# Patient Record
Sex: Male | Born: 1985 | Race: Black or African American | Hispanic: No | Marital: Single | Smoking: Current every day smoker
Health system: Southern US, Community
[De-identification: ages and names within clinical notes are randomized; demographics above are authoritative.]

## PROBLEM LIST (undated history)

## (undated) ENCOUNTER — Emergency Department: Admission: EM | Payer: Self-pay | Source: Home / Self Care

## (undated) DIAGNOSIS — M109 Gout, unspecified: Secondary | ICD-10-CM

## (undated) DIAGNOSIS — J45909 Unspecified asthma, uncomplicated: Secondary | ICD-10-CM

## (undated) HISTORY — PX: FRACTURE SURGERY: SHX138

## (undated) HISTORY — PX: OTHER SURGICAL HISTORY: SHX169

---

## 2006-04-05 ENCOUNTER — Emergency Department: Payer: Self-pay | Admitting: Emergency Medicine

## 2006-07-08 ENCOUNTER — Emergency Department: Payer: Self-pay | Admitting: Emergency Medicine

## 2006-09-25 ENCOUNTER — Emergency Department: Payer: Self-pay | Admitting: Emergency Medicine

## 2006-12-05 ENCOUNTER — Emergency Department: Payer: Self-pay | Admitting: Emergency Medicine

## 2006-12-31 ENCOUNTER — Emergency Department: Payer: Self-pay | Admitting: Emergency Medicine

## 2007-04-08 ENCOUNTER — Emergency Department: Payer: Self-pay | Admitting: Emergency Medicine

## 2007-12-28 ENCOUNTER — Emergency Department: Payer: Self-pay | Admitting: Emergency Medicine

## 2009-03-14 ENCOUNTER — Emergency Department: Payer: Self-pay | Admitting: Emergency Medicine

## 2009-09-27 ENCOUNTER — Emergency Department (HOSPITAL_COMMUNITY): Admission: EM | Admit: 2009-09-27 | Discharge: 2009-09-27 | Payer: Self-pay | Admitting: Family Medicine

## 2009-10-07 ENCOUNTER — Ambulatory Visit: Payer: Self-pay | Admitting: Psychiatry

## 2009-10-07 ENCOUNTER — Ambulatory Visit: Payer: Self-pay | Admitting: Pulmonary Disease

## 2009-10-07 ENCOUNTER — Inpatient Hospital Stay (HOSPITAL_COMMUNITY): Admission: EM | Admit: 2009-10-07 | Discharge: 2009-10-10 | Payer: Self-pay | Admitting: Emergency Medicine

## 2009-10-10 ENCOUNTER — Inpatient Hospital Stay (HOSPITAL_COMMUNITY): Admission: AD | Admit: 2009-10-10 | Discharge: 2009-10-12 | Payer: Self-pay | Admitting: Psychiatry

## 2009-10-10 ENCOUNTER — Ambulatory Visit: Payer: Self-pay | Admitting: Psychiatry

## 2009-10-27 ENCOUNTER — Ambulatory Visit: Payer: Self-pay | Admitting: Psychiatry

## 2009-11-01 ENCOUNTER — Ambulatory Visit: Payer: Self-pay | Admitting: Psychiatry

## 2009-11-14 ENCOUNTER — Emergency Department (HOSPITAL_COMMUNITY): Admission: EM | Admit: 2009-11-14 | Discharge: 2009-11-14 | Payer: Self-pay | Admitting: Family Medicine

## 2010-07-30 LAB — BLOOD GAS, ARTERIAL
Acid-base deficit: 0.8 mmol/L (ref 0.0–2.0)
Bicarbonate: 23.7 mEq/L (ref 20.0–24.0)
Drawn by: 275531
FIO2: 100 %
MECHVT: 550 mL
O2 Saturation: 99.5 %
PEEP: 5 cmH2O
Patient temperature: 98.6
RATE: 15 resp/min
TCO2: 21 mmol/L (ref 0–100)
pCO2 arterial: 40.7 mmHg (ref 35.0–45.0)
pH, Arterial: 7.382 (ref 7.350–7.450)
pO2, Arterial: 459 mmHg — ABNORMAL HIGH (ref 80.0–100.0)

## 2010-07-30 LAB — CBC
HCT: 41.3 % (ref 39.0–52.0)
HCT: 42.5 % (ref 39.0–52.0)
Hemoglobin: 13.9 g/dL (ref 13.0–17.0)
Hemoglobin: 14.4 g/dL (ref 13.0–17.0)
MCHC: 33.8 g/dL (ref 30.0–36.0)
MCHC: 33.8 g/dL (ref 30.0–36.0)
MCV: 90.5 fL (ref 78.0–100.0)
MCV: 90.7 fL (ref 78.0–100.0)
Platelets: 183 10*3/uL (ref 150–400)
Platelets: 187 10*3/uL (ref 150–400)
RBC: 4.56 MIL/uL (ref 4.22–5.81)
RBC: 4.69 MIL/uL (ref 4.22–5.81)
RDW: 12.8 % (ref 11.5–15.5)
RDW: 13.5 % (ref 11.5–15.5)
WBC: 10.1 10*3/uL (ref 4.0–10.5)
WBC: 10.1 10*3/uL (ref 4.0–10.5)

## 2010-07-30 LAB — BASIC METABOLIC PANEL
BUN: 10 mg/dL (ref 6–23)
CO2: 27 mEq/L (ref 19–32)
Calcium: 8.6 mg/dL (ref 8.4–10.5)
Chloride: 110 mEq/L (ref 96–112)
Creatinine, Ser: 1.09 mg/dL (ref 0.4–1.5)
GFR calc Af Amer: >60 mL/min (ref >60)
GFR calc non Af Amer: >60 mL/min (ref >60)
Glucose, Bld: 118 mg/dL — ABNORMAL HIGH (ref 70–99)
Potassium: 3.9 mEq/L (ref 3.5–5.1)
Sodium: 140 mEq/L (ref 135–145)

## 2010-07-30 LAB — CULTURE, BLOOD (ROUTINE X 2)
Culture: NO GROWTH
Culture: NO GROWTH

## 2010-07-30 LAB — POCT I-STAT, CHEM 8
BUN: 13 mg/dL (ref 6–23)
Calcium, Ion: 1.05 mmol/L — ABNORMAL LOW (ref 1.12–1.32)
Chloride: 105 mEq/L (ref 96–112)
Creatinine, Ser: 0.9 mg/dL (ref 0.4–1.5)
Glucose, Bld: 147 mg/dL — ABNORMAL HIGH (ref 70–99)
HCT: 46 % (ref 39.0–52.0)
Hemoglobin: 15.6 g/dL (ref 13.0–17.0)
Potassium: 3.1 mEq/L — ABNORMAL LOW (ref 3.5–5.1)
Sodium: 141 mEq/L (ref 135–145)
TCO2: 21 mmol/L (ref 0–100)

## 2010-07-30 LAB — DIFFERENTIAL
Basophils Absolute: 0 10*3/uL (ref 0.0–0.1)
Basophils Relative: 0 % (ref 0–1)
Eosinophils Absolute: 0 10*3/uL (ref 0.0–0.7)
Eosinophils Relative: 0 % (ref 0–5)
Lymphocytes Relative: 9 % — ABNORMAL LOW (ref 12–46)
Lymphs Abs: 1 10*3/uL (ref 0.7–4.0)
Monocytes Absolute: 0.8 10*3/uL (ref 0.1–1.0)
Monocytes Relative: 8 % (ref 3–12)
Neutro Abs: 8.3 10*3/uL — ABNORMAL HIGH (ref 1.7–7.7)
Neutrophils Relative %: 83 % — ABNORMAL HIGH (ref 43–77)

## 2010-07-30 LAB — HEPATIC FUNCTION PANEL
ALT: 27 U/L (ref 0–53)
AST: 31 U/L (ref 0–37)
Albumin: 3.7 g/dL (ref 3.5–5.2)
Alkaline Phosphatase: 42 U/L (ref 39–117)
Bilirubin, Direct: 0.3 mg/dL (ref 0.0–0.3)
Indirect Bilirubin: 0.7 mg/dL (ref 0.3–0.9)
Total Bilirubin: 1 mg/dL (ref 0.3–1.2)
Total Protein: 6.6 g/dL (ref 6.0–8.3)

## 2010-07-30 LAB — RAPID URINE DRUG SCREEN, HOSP PERFORMED
Amphetamines: NOT DETECTED
Barbiturates: NOT DETECTED
Benzodiazepines: NOT DETECTED
Cocaine: NOT DETECTED
Opiates: NOT DETECTED
Tetrahydrocannabinol: NOT DETECTED

## 2010-07-30 LAB — TRICYCLICS SCREEN, URINE: TCA Scrn: POSITIVE — AB

## 2010-07-30 LAB — PROTIME-INR
INR: 1.18 (ref 0.00–1.49)
Prothrombin Time: 14.9 seconds (ref 11.6–15.2)

## 2010-07-30 LAB — SALICYLATE LEVEL: Salicylate Lvl: <4 mg/dL (ref 2.8–20.0)

## 2010-07-30 LAB — ACETAMINOPHEN LEVEL: Acetaminophen (Tylenol), Serum: <10 ug/mL — ABNORMAL LOW (ref 10–30)

## 2010-07-30 LAB — ETHANOL: Alcohol, Ethyl (B): <5 mg/dL (ref 0–10)

## 2010-07-30 LAB — MRSA PCR SCREENING: MRSA by PCR: NEGATIVE

## 2011-06-06 ENCOUNTER — Emergency Department: Payer: Self-pay | Admitting: Emergency Medicine

## 2011-06-08 ENCOUNTER — Emergency Department: Payer: Self-pay | Admitting: Internal Medicine

## 2011-11-06 ENCOUNTER — Emergency Department: Payer: Self-pay | Admitting: Internal Medicine

## 2012-03-19 ENCOUNTER — Emergency Department: Payer: Self-pay | Admitting: Unknown Physician Specialty

## 2012-03-20 ENCOUNTER — Emergency Department: Payer: Self-pay | Admitting: Emergency Medicine

## 2012-05-17 ENCOUNTER — Emergency Department: Payer: Self-pay | Admitting: Internal Medicine

## 2012-10-15 ENCOUNTER — Emergency Department: Payer: Self-pay | Admitting: Emergency Medicine

## 2012-11-02 ENCOUNTER — Emergency Department: Payer: Self-pay | Admitting: Emergency Medicine

## 2012-11-02 LAB — URINALYSIS, COMPLETE
Bacteria: NONE SEEN
Bilirubin,UR: NEGATIVE
Blood: NEGATIVE
Glucose,UR: NEGATIVE mg/dL (ref 0–75)
Ketone: NEGATIVE
Nitrite: NEGATIVE
Ph: 7 (ref 4.5–8.0)
Protein: NEGATIVE
RBC,UR: 2 /HPF (ref 0–5)
Specific Gravity: 1.015 (ref 1.003–1.030)
Squamous Epithelial: NONE SEEN
WBC UR: 5 /HPF (ref 0–5)

## 2012-11-02 LAB — COMPREHENSIVE METABOLIC PANEL
Albumin: 3.8 g/dL (ref 3.4–5.0)
Alkaline Phosphatase: 61 U/L (ref 50–136)
Anion Gap: 6 — ABNORMAL LOW (ref 7–16)
BUN: 22 mg/dL — ABNORMAL HIGH (ref 7–18)
Bilirubin,Total: 0.4 mg/dL (ref 0.2–1.0)
Calcium, Total: 8.8 mg/dL (ref 8.5–10.1)
Chloride: 104 mmol/L (ref 98–107)
Co2: 28 mmol/L (ref 21–32)
Creatinine: 1.24 mg/dL (ref 0.60–1.30)
EGFR (African American): >60
EGFR (Non-African Amer.): >60
Glucose: 99 mg/dL (ref 65–99)
Osmolality: 279 (ref 275–301)
Potassium: 3.9 mmol/L (ref 3.5–5.1)
SGOT(AST): 31 U/L (ref 15–37)
SGPT (ALT): 38 U/L (ref 12–78)
Sodium: 138 mmol/L (ref 136–145)
Total Protein: 7.8 g/dL (ref 6.4–8.2)

## 2012-11-02 LAB — CBC
HCT: 43.6 % (ref 40.0–52.0)
HGB: 15 g/dL (ref 13.0–18.0)
MCH: 30 pg (ref 26.0–34.0)
MCHC: 34.4 g/dL (ref 32.0–36.0)
MCV: 87 fL (ref 80–100)
Platelet: 255 10*3/uL (ref 150–440)
RBC: 5 10*6/uL (ref 4.40–5.90)
RDW: 13.6 % (ref 11.5–14.5)
WBC: 12.1 10*3/uL — ABNORMAL HIGH (ref 3.8–10.6)

## 2012-11-02 LAB — LIPASE, BLOOD: Lipase: 95 U/L (ref 73–393)

## 2012-11-03 LAB — CK
CK, Total: 672 U/L — ABNORMAL HIGH (ref 35–232)
CK, Total: 462 U/L — ABNORMAL HIGH (ref 35–232)

## 2013-01-22 ENCOUNTER — Emergency Department: Payer: Self-pay | Admitting: Emergency Medicine

## 2013-01-22 LAB — CBC
HCT: 43.9 % (ref 40.0–52.0)
HGB: 15.4 g/dL (ref 13.0–18.0)
MCH: 30.4 pg (ref 26.0–34.0)
MCHC: 35 g/dL (ref 32.0–36.0)
MCV: 87 fL (ref 80–100)
Platelet: 255 10*3/uL (ref 150–440)
RBC: 5.06 10*6/uL (ref 4.40–5.90)
RDW: 13.5 % (ref 11.5–14.5)
WBC: 8.8 10*3/uL (ref 3.8–10.6)

## 2013-01-22 LAB — BASIC METABOLIC PANEL
Anion Gap: 3 — ABNORMAL LOW (ref 7–16)
BUN: 13 mg/dL (ref 7–18)
Calcium, Total: 9 mg/dL (ref 8.5–10.1)
Chloride: 107 mmol/L (ref 98–107)
Co2: 29 mmol/L (ref 21–32)
Creatinine: 1.05 mg/dL (ref 0.60–1.30)
EGFR (African American): >60
EGFR (Non-African Amer.): >60
Glucose: 95 mg/dL (ref 65–99)
Osmolality: 277 (ref 275–301)
Potassium: 3.7 mmol/L (ref 3.5–5.1)
Sodium: 139 mmol/L (ref 136–145)

## 2013-01-22 LAB — TROPONIN I: Troponin-I: <0.02 ng/mL

## 2013-04-12 ENCOUNTER — Emergency Department: Payer: Self-pay | Admitting: Emergency Medicine

## 2013-05-22 ENCOUNTER — Emergency Department: Payer: Self-pay | Admitting: Emergency Medicine

## 2013-06-07 ENCOUNTER — Emergency Department: Payer: Self-pay | Admitting: Emergency Medicine

## 2013-06-28 ENCOUNTER — Emergency Department: Payer: Self-pay | Admitting: Emergency Medicine

## 2013-06-29 ENCOUNTER — Emergency Department: Payer: Self-pay | Admitting: Emergency Medicine

## 2013-08-20 ENCOUNTER — Emergency Department: Payer: Self-pay | Admitting: Emergency Medicine

## 2013-12-17 ENCOUNTER — Emergency Department: Payer: Self-pay | Admitting: Emergency Medicine

## 2014-01-06 ENCOUNTER — Emergency Department: Payer: Self-pay | Admitting: Student

## 2014-02-20 ENCOUNTER — Emergency Department: Payer: Self-pay | Admitting: Emergency Medicine

## 2014-03-04 ENCOUNTER — Emergency Department: Payer: Self-pay | Admitting: Emergency Medicine

## 2014-03-04 LAB — TROPONIN I: Troponin-I: <0.02 ng/mL

## 2014-03-17 ENCOUNTER — Emergency Department: Payer: Self-pay | Admitting: Emergency Medicine

## 2014-03-25 ENCOUNTER — Emergency Department: Payer: Self-pay | Admitting: Emergency Medicine

## 2014-04-01 ENCOUNTER — Emergency Department: Payer: Self-pay | Admitting: Emergency Medicine

## 2014-04-16 ENCOUNTER — Emergency Department: Payer: Self-pay | Admitting: Emergency Medicine

## 2014-05-03 ENCOUNTER — Emergency Department: Payer: Self-pay | Admitting: Emergency Medicine

## 2014-05-05 ENCOUNTER — Emergency Department: Payer: Self-pay | Admitting: Emergency Medicine

## 2014-05-27 ENCOUNTER — Emergency Department: Payer: Self-pay | Admitting: Emergency Medicine

## 2014-07-03 ENCOUNTER — Emergency Department: Payer: Self-pay | Admitting: Emergency Medicine

## 2014-07-16 ENCOUNTER — Emergency Department: Payer: Self-pay | Admitting: Emergency Medicine

## 2014-09-28 ENCOUNTER — Emergency Department
Admission: EM | Admit: 2014-09-28 | Discharge: 2014-09-28 | Disposition: A | Discharge status: Home / Self Care | Payer: Self-pay | Attending: Emergency Medicine | Admitting: Emergency Medicine

## 2014-09-28 DIAGNOSIS — Z720 Tobacco use: Secondary | ICD-10-CM | POA: Insufficient documentation

## 2014-09-28 DIAGNOSIS — K529 Noninfective gastroenteritis and colitis, unspecified: Secondary | ICD-10-CM | POA: Insufficient documentation

## 2014-09-28 HISTORY — DX: Unspecified asthma, uncomplicated: J45.909

## 2014-09-28 MED ORDER — DICYCLOMINE HCL 20 MG PO TABS: 20.0000 mg | ORAL_TABLET | Freq: Once | ORAL | Status: DC

## 2014-09-28 MED ORDER — LOPERAMIDE HCL 2 MG PO CAPS
ORAL_CAPSULE | ORAL | Status: AC
  Filled 2014-09-28: qty 2

## 2014-09-28 MED ORDER — PROMETHAZINE HCL 25 MG PO TABS: 25.0000 mg | ORAL_TABLET | ORAL | Status: DC | PRN

## 2014-09-28 MED ORDER — PROMETHAZINE HCL 25 MG PO TABS
50.0000 mg | ORAL_TABLET | Freq: Once | ORAL | Status: AC
  Administered 2014-09-28: 50 mg via ORAL

## 2014-09-28 MED ORDER — DICYCLOMINE HCL 20 MG PO TABS: 9.0000 mg | ORAL_TABLET | Freq: Three times a day (TID) | ORAL | Status: DC | PRN

## 2014-09-28 MED ORDER — DICYCLOMINE HCL 10 MG PO CAPS
ORAL_CAPSULE | ORAL | Status: AC
  Administered 2014-09-28: 20 mg
  Filled 2014-09-28: qty 2

## 2014-09-28 MED ORDER — PROMETHAZINE HCL 25 MG PO TABS
ORAL_TABLET | ORAL | Status: AC
  Filled 2014-09-28: qty 2

## 2014-09-28 MED ORDER — LOPERAMIDE HCL 2 MG PO CAPS
4.0000 mg | ORAL_CAPSULE | Freq: Once | ORAL | Status: AC
  Administered 2014-09-28: 4 mg via ORAL

## 2014-09-28 NOTE — ED Notes (Signed)
Pt states he has had diarrhea X4-5 times, vomit X 3 times last night. Mild left sided abdominal pain. Pt also c/o back pain since sickness. Pt alert and oriented X4, active, cooperative, pt in NAD. RR even and unlabored, color WNL.

## 2014-09-28 NOTE — ED Provider Notes (Signed)
[facility] Emergency Department Provider Note     Time seen: 7:35 AM  I have reviewed the triage vital signs and the nursing notes.   HISTORY  Chief Complaint Emesis; Diarrhea; and Back Pain    HPI Jesus Singleton is a 29 y.o. male since ER for diarrhea 4-5 times, vomiting 3 last night. Patient does report some abdominal pain and back pain.Patient is unsure if he is Hand-foot-and-mouth disease from his son. States he woke up in the middle of night with the diarrhea and vomiting, he has more diarrhea than vomiting at this point. He has some left-sided abdominal pain that is dull. Nothing makes symptoms better or worse.     Past Medical History  Diagnosis Date  . Asthma     There are no active problems to display for this patient.   Past Surgical History  Procedure Laterality Date  . Bilateral leg surgery      No current outpatient prescriptions on file.  Allergies Shellfish allergy  History reviewed. No pertinent family history.  Social History History  Substance Use Topics  . Smoking status: Current Every Day Smoker  . Smokeless tobacco: Not on file  . Alcohol Use: No    Review of Systems Constitutional: Negative for fever. Eyes: Negative for visual changes. ENT: Negative for sore throat. Cardiovascular: Negative for chest pain. Respiratory: Negative for shortness of breath. Gastrointestinal: Positive for abdominal pain vomiting and diarrhea Genitourinary: Negative for dysuria. Musculoskeletal: Negative for back pain. Skin: Negative for rash. Neurological: Negative for headaches, focal weakness or numbness.  10-point ROS otherwise negative.  ____________________________________________   PHYSICAL EXAM:  VITAL SIGNS: ED Triage Vitals  Enc Vitals Group     BP 09/28/14 0727 135/84 mmHg     Pulse Rate 09/28/14 0727 90     Resp 09/28/14 0727 18     Temp 09/28/14 0727 98 F (36.7 C)     Temp Source 09/28/14 0727 Oral      SpO2 09/28/14 0727 98 %     Weight 09/28/14 0727 285 lb (129.275 kg)     Height 09/28/14 0727 6' (1.829 m)     Head Cir --      Peak Flow --      Pain Score 09/28/14 0728 8     Pain Loc --      Pain Edu? --      Excl. in GC? --     Constitutional: Alert and oriented. Well appearing and in no distress. Eyes: Conjunctivae are normal. PERRL. Normal extraocular movements. ENT   Head: Normocephalic and atraumatic.   Nose: No congestion/rhinnorhea.   Mouth/Throat: Mucous membranes are moist.   Neck: No stridor. Hematological/Lymphatic/Immunilogical: No cervical lymphadenopathy. Cardiovascular: Normal rate, regular rhythm. Normal and symmetric distal pulses are present in all extremities. No murmurs, rubs, or gallops. Respiratory: Normal respiratory effort without tachypnea nor retractions. Breath sounds are clear and equal bilaterally. No wheezes/rales/rhonchi. Gastrointestinal: Mild left-sided abdominal tenderness. No rebound or guarding. Next  Musculoskeletal: Nontender with normal range of motion in all extremities. No joint effusions.  No lower extremity tenderness nor edema. Neurologic:  Normal speech and language. No gross focal neurologic deficits are appreciated. Speech is normal. No gait instability. Skin:  Skin is warm, dry and intact. No rash noted. Psychiatric: Mood and affect are normal. Speech and behavior are normal. Patient exhibits appropriate insight and judgment.  ____________________________________________  ED COURSE:  Pertinent labs & imaging results that were available during my care of the  patient were reviewed by me and considered in my medical decision making (see chart for details).  Patient looks well, does not have severe symptoms. Will give by mouth medications ____________________________________________   RADIOLOGY  None  ____________________________________________    FINAL ASSESSMENT AND PLAN  Gastroenteritis  Plan:  Patient with mild symptoms at this time. We'll discharge with Phenergan and Bentyl and loperamide. He is stable for outpatient follow-up.    Emily FilbertWilliams, Lance Galas E, MD   Emily FilbertJonathan E Kanaan Kagawa, MD 09/28/14 [phone]

## 2014-09-28 NOTE — Discharge Instructions (Signed)

## 2014-11-19 ENCOUNTER — Emergency Department
Admission: EM | Admit: 2014-11-19 | Discharge: 2014-11-19 | Disposition: A | Discharge status: Home / Self Care | Payer: Self-pay | Attending: Emergency Medicine | Admitting: Emergency Medicine

## 2014-11-19 ENCOUNTER — Encounter: Payer: Self-pay | Admitting: General Practice

## 2014-11-19 ENCOUNTER — Emergency Department: Payer: Self-pay

## 2014-11-19 DIAGNOSIS — M109 Gout, unspecified: Secondary | ICD-10-CM

## 2014-11-19 DIAGNOSIS — M10071 Idiopathic gout, right ankle and foot: Secondary | ICD-10-CM | POA: Insufficient documentation

## 2014-11-19 DIAGNOSIS — M79674 Pain in right toe(s): Secondary | ICD-10-CM | POA: Insufficient documentation

## 2014-11-19 DIAGNOSIS — Z87891 Personal history of nicotine dependence: Secondary | ICD-10-CM | POA: Insufficient documentation

## 2014-11-19 HISTORY — DX: Gout, unspecified: M10.9

## 2014-11-19 MED ORDER — TRAMADOL HCL 50 MG PO TABS: 50.0000 mg | ORAL_TABLET | Freq: Three times a day (TID) | ORAL | Status: DC | PRN

## 2014-11-19 MED ORDER — NAPROXEN 500 MG PO TABS: 500.0000 mg | ORAL_TABLET | Freq: Two times a day (BID) | ORAL | Status: DC

## 2014-11-19 NOTE — Discharge Instructions (Signed)
Take medication as prescribed. Elevate. Rest foot.  Follow-up with your primary care physician or the above week. Return to the ER for new or worsening concerns.  Gout Gout is an inflammatory arthritis caused by a buildup of uric acid crystals in the joints. Uric acid is a chemical that is normally present in the blood. When the level of uric acid in the blood is too high it can form crystals that deposit in your joints and tissues. This causes joint redness, soreness, and swelling (inflammation). Repeat attacks are common. Over time, uric acid crystals can form into masses (tophi) near a joint, destroying bone and causing disfigurement. Gout is treatable and often preventable. CAUSES  The disease begins with elevated levels of uric acid in the blood. Uric acid is produced by your body when it breaks down a naturally found substance called purines. Certain foods you eat, such as meats and fish, contain high amounts of purines. Causes of an elevated uric acid level include:  Being passed down from parent to child (heredity).  Diseases that cause increased uric acid production (such as obesity, psoriasis, and certain cancers).  Excessive alcohol use.  Diet, especially diets rich in meat and seafood.  Medicines, including certain cancer-fighting medicines (chemotherapy), water pills (diuretics), and aspirin.  Chronic kidney disease. The kidneys are no longer able to remove uric acid well.  Problems with metabolism. Conditions strongly associated with gout include:  Obesity.  High blood pressure.  High cholesterol.  Diabetes. Not everyone with elevated uric acid levels gets gout. It is not understood why some people get gout and others do not. Surgery, joint injury, and eating too much of certain foods are some of the factors that can lead to gout attacks. SYMPTOMS   An attack of gout comes on quickly. It causes intense pain with redness, swelling, and warmth in a joint.  Fever can  occur.  Often, only one joint is involved. Certain joints are more commonly involved:  Base of the big toe.  Knee.  Ankle.  Wrist.  Finger. Without treatment, an attack usually goes away in a few days to weeks. Between attacks, you usually will not have symptoms, which is different from many other forms of arthritis. DIAGNOSIS  Your caregiver will suspect gout based on your symptoms and exam. In some cases, tests may be recommended. The tests may include:  Blood tests.  Urine tests.  X-rays.  Joint fluid exam. This exam requires a needle to remove fluid from the joint (arthrocentesis). Using a microscope, gout is confirmed when uric acid crystals are seen in the joint fluid. TREATMENT  There are two phases to gout treatment: treating the sudden onset (acute) attack and preventing attacks (prophylaxis).  Treatment of an Acute Attack.  Medicines are used. These include anti-inflammatory medicines or steroid medicines.  An injection of steroid medicine into the affected joint is sometimes necessary.  The painful joint is rested. Movement can worsen the arthritis.  You may use warm or cold treatments on painful joints, depending which works best for you.  Treatment to Prevent Attacks.  If you suffer from frequent gout attacks, your caregiver may advise preventive medicine. These medicines are started after the acute attack subsides. These medicines either help your kidneys eliminate uric acid from your body or decrease your uric acid production. You may need to stay on these medicines for a very long time.  The early phase of treatment with preventive medicine can be associated with an increase in acute gout attacks.  For this reason, during the first few months of treatment, your caregiver may also advise you to take medicines usually used for acute gout treatment. Be sure you understand your caregiver's directions. Your caregiver may make several adjustments to your medicine  dose before these medicines are effective.  Discuss dietary treatment with your caregiver or dietitian. Alcohol and drinks high in sugar and fructose and foods such as meat, poultry, and seafood can increase uric acid levels. Your caregiver or dietitian can advise you on drinks and foods that should be limited. HOME CARE INSTRUCTIONS   Do not take aspirin to relieve pain. This raises uric acid levels.  Only take over-the-counter or prescription medicines for pain, discomfort, or fever as directed by your caregiver.  Rest the joint as much as possible. When in bed, keep sheets and blankets off painful areas.  Keep the affected joint raised (elevated).  Apply warm or cold treatments to painful joints. Use of warm or cold treatments depends on which works best for you.  Use crutches if the painful joint is in your leg.  Drink enough fluids to keep your urine clear or pale yellow. This helps your body get rid of uric acid. Limit alcohol, sugary drinks, and fructose drinks.  Follow your dietary instructions. Pay careful attention to the amount of protein you eat. Your daily diet should emphasize fruits, vegetables, whole grains, and fat-free or low-fat milk products. Discuss the use of coffee, vitamin C, and cherries with your caregiver or dietitian. These may be helpful in lowering uric acid levels.  Maintain a healthy body weight. SEEK MEDICAL CARE IF:   You develop diarrhea, vomiting, or any side effects from medicines.  You do not feel better in 24 hours, or you are getting worse. SEEK IMMEDIATE MEDICAL CARE IF:   Your joint becomes suddenly more tender, and you have chills or a fever. MAKE SURE YOU:   Understand these instructions.  Will watch your condition.  Will get help right away if you are not doing well or get worse. Document Released: 04/26/2000 Document Revised: 09/13/2013 Document Reviewed: 12/11/2011 [facility] Patient Information 2015 [place], [place]. This information  is not intended to replace advice given to you by your health care provider. Make sure you discuss any questions you have with your health care provider.

## 2014-11-19 NOTE — ED Provider Notes (Signed)
[facility] Provider [facility]  ____________________________________________  Time seen: Approximately 5:49 PM  I have reviewed the triage vital signs and the nursing notes.   HISTORY  Chief Complaint Foot Pain   HPI Jesus Singleton is a 29 y.o. male presents to the ER for the complaint of right great toe pain. Patient states that he has had a history of this for several years. Patient states that this pain occurs every few months. Patient states that he has been diagnosed by a podiatrist with gout. Patient states that this pain feels similar and that is what he thinks it is. Denies fall or injury.  Patient states that last night he had some pain to the right great toe but pain was worse today. Patient states that pain is currently 6 out of 10 to right great toe. States pain is present with movement, walking, as well as light touch. Denies recent break in skin or cuts. Denies numbness or tingling. Denies other pain or injury. Denies pain radiation.Denies fever, recent sickness, or change in oral intake.  Denies known triggers.   Past Medical History  Diagnosis Date  . Asthma   . Gout     There are no active problems to display for this patient.   Past Surgical History  Procedure Laterality Date  . Bilateral leg surgery      Current Outpatient Rx  Name  Route  Sig  Dispense  Refill  . none          .             Allergies Shellfish allergy  No family history on file.  Social History History  Substance Use Topics  . Smoking status: Former Smoker    Types: Cigarettes  . Smokeless tobacco: Not on file  . Alcohol Use: No    Review of Systems Constitutional: No fever/chills Eyes: No visual changes. ENT: No sore throat. Cardiovascular: Denies chest pain. Respiratory: Denies shortness of breath. Gastrointestinal: No abdominal pain.  No nausea, no vomiting.  No diarrhea.  No constipation. Genitourinary: Negative for  dysuria. Musculoskeletal: Negative for back pain. Right great toe pain. Skin: Negative for rash. Neurological: Negative for headaches, focal weakness or numbness.  10-point ROS otherwise negative.  ____________________________________________   PHYSICAL EXAM:  VITAL SIGNS: ED Triage Vitals  Enc Vitals Group     BP 11/19/14 1654 127/77 mmHg     Pulse Rate 11/19/14 1651 93     Resp 11/19/14 1651 18     Temp 11/19/14 1651 98.2 F (36.8 C)     Temp Source 11/19/14 1651 Oral     SpO2 11/19/14 1651 96 %     Weight 11/19/14 1651 280 lb (127.007 kg)     Height 11/19/14 1651 6' (1.829 m)     Head Cir --      Peak Flow --      Pain Score 11/19/14 1653 8     Pain Loc --      Pain Edu? --      Excl. in GC? --     Constitutional: Alert and oriented. Well appearing and in no acute distress. Eyes: Conjunctivae are normal. PERRL. EOMI. Head: Atraumatic. Nose: No congestion/rhinnorhea. Mouth/Throat: Mucous membranes are moist.  Oropharynx non-erythematous. Neck: No stridor.  No cervical spine tenderness to palpation. Hematological/Lymphatic/Immunilogical: No cervical lymphadenopathy. Cardiovascular: Normal rate, regular rhythm. Grossly normal heart sounds.  Good peripheral circulation. Respiratory: Normal respiratory effort.  No retractions. Lungs CTAB. Gastrointestinal: Soft  and nontender. No distention. No abdominal bruits. No CVA tenderness. Musculoskeletal: No lower extremity tenderness nor edema.  No joint effusions. Except: right great toe mod TTP, minimal swelling. No erythema, Skin intact. No signs of infection. Sensation and motor intact. Bilateral pedal pulses equal and easily palpated. Full range of motion. Right foot otherwise nontender. Right calf nontender. Right leg otherwise nontender. Neurologic:  Normal speech and language. No gross focal neurologic deficits are appreciated. Speech is normal. No gait instability. Skin:  Skin is warm, dry and intact. No rash  noted. Psychiatric: Mood and affect are normal. Speech and behavior are normal.  ____________________________________________   LABS (all labs ordered are listed, but only abnormal results are displayed)  Labs Reviewed - No data to display  RADIOLOGY RIGHT GREAT TOE  COMPARISON: 11/02/2012  FINDINGS: There is no evidence of fracture or dislocation. There is no evidence of arthropathy or other focal bone abnormality. Soft tissues are unremarkable.  IMPRESSION: Negative.   Electronically Signed By: Harmon Pier M.D. On: 11/19/2014 18:26  ____________________________________________  INITIAL IMPRESSION / ASSESSMENT AND PLAN / ED COURSE  Pertinent labs & imaging results that were available during my care of the patient were reviewed by me and considered in my medical decision making (see chart for details).  Very well-appearing patient. No acute distress. Presents the ER for the complaints of right great toe pain. Patient reports that he has a history of gout and that this feels similar. Denies fall or injury.  Right great toe x-ray negative. Will treat patient with oral naproxen as well as tramadol as needed for pain. Discussed rest and elevation. Patient denies need for crutches, and states that he has crutches at home to use if needed. Discussed use crutches to rest foot. Follow-up with primary care physician this coming week as needed. Discussed follow-up and return parameters. Patient verbalized understanding and agreed to plan. ____________________________________________   FINAL CLINICAL IMPRESSION(S) / ED DIAGNOSES  Final diagnoses:  Great toe pain, right  Acute gout of right foot, unspecified cause      Renford Dills, NP 11/19/14 1840  Loleta Rose, MD 11/19/14 [id number]

## 2014-11-19 NOTE — ED Notes (Signed)
Pt. Arrived to ed from home with reports of painful right foot. Hx of gout. Pt reports that pt started this AM. Denies injury. Pt states "i think its gout". Pt alert and oriented.  Favoring left foot when ambulating.

## 2014-11-26 ENCOUNTER — Emergency Department
Admission: EM | Admit: 2014-11-26 | Discharge: 2014-11-26 | Disposition: A | Discharge status: Home / Self Care | Payer: Self-pay | Attending: Emergency Medicine | Admitting: Emergency Medicine

## 2014-11-26 ENCOUNTER — Encounter: Payer: Self-pay | Admitting: Emergency Medicine

## 2014-11-26 DIAGNOSIS — Z79899 Other long term (current) drug therapy: Secondary | ICD-10-CM | POA: Insufficient documentation

## 2014-11-26 DIAGNOSIS — Z791 Long term (current) use of non-steroidal anti-inflammatories (NSAID): Secondary | ICD-10-CM | POA: Insufficient documentation

## 2014-11-26 DIAGNOSIS — Z720 Tobacco use: Secondary | ICD-10-CM | POA: Insufficient documentation

## 2014-11-26 DIAGNOSIS — M722 Plantar fascial fibromatosis: Secondary | ICD-10-CM | POA: Insufficient documentation

## 2014-11-26 MED ORDER — PREDNISONE 20 MG PO TABS
60.0000 mg | ORAL_TABLET | Freq: Once | ORAL | Status: AC
  Administered 2014-11-26: 60 mg via ORAL
  Filled 2014-11-26: qty 3

## 2014-11-26 MED ORDER — KETOROLAC TROMETHAMINE 60 MG/2ML IM SOLN
60.0000 mg | Freq: Once | INTRAMUSCULAR | Status: AC
  Administered 2014-11-26: 60 mg via INTRAMUSCULAR
  Filled 2014-11-26: qty 2

## 2014-11-26 MED ORDER — PREDNISONE 10 MG (21) PO TBPK
ORAL_TABLET | ORAL | Status: DC
Start: 2014-11-26 — End: 2015-03-01

## 2014-11-26 MED ORDER — HYDROCODONE-ACETAMINOPHEN 5-325 MG PO TABS: 1.0000 | ORAL_TABLET | ORAL | Status: DC | PRN

## 2014-11-26 NOTE — Discharge Instructions (Signed)
Plantar Fasciitis  Plantar fasciitis is a common condition that causes foot pain. It is soreness (inflammation) of the band of tough fibrous tissue on the bottom of the foot that runs from the heel bone (calcaneus) to the ball of the foot. The cause of this soreness may be from excessive standing, poor fitting shoes, running on hard surfaces, being overweight, having an abnormal walk, or overuse (this is common in runners) of the painful foot or feet. It is also common in aerobic exercise dancers and ballet dancers.  SYMPTOMS   Most people with plantar fasciitis complain of:   Severe pain in the morning on the bottom of their foot especially when taking the first steps out of bed. This pain recedes after a few minutes of walking.   Severe pain is experienced also during walking following a long period of inactivity.   Pain is worse when walking barefoot or up stairs  DIAGNOSIS    Your caregiver will diagnose this condition by examining and feeling your foot.   Special tests such as X-rays of your foot, are usually not needed.  PREVENTION    Consult a sports medicine professional before beginning a new exercise program.   Walking programs offer a good workout. With walking there is a lower chance of overuse injuries common to runners. There is less impact and less jarring of the joints.   Begin all new exercise programs slowly. If problems or pain develop, decrease the amount of time or distance until you are at a comfortable level.   Wear good shoes and replace them regularly.   Stretch your foot and the heel cords at the back of the ankle (Achilles tendon) both before and after exercise.   Run or exercise on even surfaces that are not hard. For example, asphalt is better than pavement.   Do not run barefoot on hard surfaces.   If using a treadmill, vary the incline.   Do not continue to workout if you have foot or joint problems. Seek professional help if they do not improve.  HOME CARE INSTRUCTIONS     Avoid activities that cause you pain until you recover.   Use ice or cold packs on the problem or painful areas after working out.   Only take over-the-counter or prescription medicines for pain, discomfort, or fever as directed by your caregiver.   Soft shoe inserts or athletic shoes with air or gel sole cushions may be helpful.   If problems continue or become more severe, consult a sports medicine caregiver or your own health care provider. Cortisone is a potent anti-inflammatory medication that may be injected into the painful area. You can discuss this treatment with your caregiver.  MAKE SURE YOU:    Understand these instructions.   Will watch your condition.   Will get help right away if you are not doing well or get worse.  Document Released: 01/22/2001 Document Revised: 07/22/2011 Document Reviewed: 03/23/2008  ExitCare Patient Information 2015 [employer]. This information is not intended to replace advice given to you by your health care provider. Make sure you discuss any questions you have with your health care provider.

## 2014-11-26 NOTE — ED Notes (Signed)
R foot pain x 1 week, denies injury, seen previously and had xray

## 2014-11-26 NOTE — ED Provider Notes (Signed)
[facility] Emergency Department Provider Note ____________________________________________  Time seen: Approximately 8:13 PM  I have reviewed the triage vital signs and the nursing notes.   HISTORY  Chief Complaint Foot Pain   HPI Jesus Singleton is a 29 y.o. male who presents to the emergency department for right foot pain. He states pain is been present for about one week. He was seen here last week and placed on Naprosyn and tramadol. He states these medications did not help the pain. He states that the pain is now more in the joints of the great toe and on the heel. He states the pain feels like previous gout flares.   Past Medical History  Diagnosis Date  . Asthma   . Gout     There are no active problems to display for this patient.   Past Surgical History  Procedure Laterality Date  . Bilateral leg surgery      Current Outpatient Rx  Name  Route  Sig  Dispense  Refill  . dicyclomine (BENTYL) 20 MG tablet   Oral   Take 0.5 tablets (10 mg total) by mouth 3 (three) times daily as needed for spasms.   30 tablet   0   . HYDROcodone-acetaminophen (NORCO/VICODIN) 5-325 MG per tablet   Oral   Take 1 tablet by mouth every 4 (four) hours as needed for moderate pain.   12 tablet   0   . naproxen (NAPROSYN) 500 MG tablet   Oral   Take 1 tablet (500 mg total) by mouth 2 (two) times daily with a meal.   14 tablet   0   . predniSONE (STERAPRED UNI-PAK 21 TAB) 10 MG (21) TBPK tablet      Take 6 tablets on day 1 Take 5 tablets on day 2 Take 4 tablets on day 3 Take 3 tablets on day 4 Take 2 tablets on day 5 Take 1 tablet on day 6   21 tablet   0   . promethazine (PHENERGAN) 25 MG tablet   Oral   Take 1 tablet (25 mg total) by mouth every 4 (four) hours as needed for nausea or vomiting.   30 tablet   1   . traMADol (ULTRAM) 50 MG tablet   Oral   Take 1 tablet (50 mg total) by mouth every 8 (eight) hours as needed (Do not drive or  operate machinery while taking as can cause drowsiness.).   12 tablet   0     Allergies Shellfish allergy  No family history on file.  Social History History  Substance Use Topics  . Smoking status: Current Every Day Smoker -- 0.20 packs/day    Types: Cigarettes  . Smokeless tobacco: Not on file  . Alcohol Use: No    Review of Systems Constitutional: No recent illness. Eyes: No visual changes. ENT: No sore throat. Cardiovascular: Denies chest pain or palpitations. Respiratory: Denies shortness of breath. Gastrointestinal: No abdominal pain.  Genitourinary: Negative for dysuria. Musculoskeletal: Pain in right great toe and heel. Skin: Negative for rash. Neurological: Negative for headaches, focal weakness or numbness. 10-point ROS otherwise negative.  ____________________________________________   PHYSICAL EXAM:  VITAL SIGNS: ED Triage Vitals  Enc Vitals Group     BP 11/26/14 1846 150/102 mmHg     Pulse Rate 11/26/14 1846 106     Resp 11/26/14 1846 18     Temp 11/26/14 1846 98.2 F (36.8 C)     Temp Source 11/26/14 1846 Oral  SpO2 11/26/14 1846 99 %     Weight 11/26/14 1846 285 lb (129.275 kg)     Height 11/26/14 1846 6' (1.829 m)     Head Cir --      Peak Flow --      Pain Score 11/26/14 1847 10     Pain Loc --      Pain Edu? --      Excl. in GC? --     Constitutional: Alert and oriented. Well appearing and in no acute distress. Eyes: Conjunctivae are normal. EOMI. Head: Atraumatic. Nose: No congestion/rhinnorhea. Neck: No stridor.  Respiratory: Normal respiratory effort.   Musculoskeletal: MTP joint mildly erythematous; no edema; heel tender to touch; no erythema or edema. Neurologic:  Normal speech and language. No gross focal neurologic deficits are appreciated. Speech is normal. No gait instability. Skin:  Skin is warm, dry and intact. Atraumatic. Psychiatric: Mood and affect are normal. Speech and behavior are  normal.  ____________________________________________   LABS (all labs ordered are listed, but only abnormal results are displayed)  Labs Reviewed - No data to display ____________________________________________  RADIOLOGY  Not indicated. ____________________________________________   PROCEDURES  Procedure(s) performed: None   ____________________________________________   INITIAL IMPRESSION / ASSESSMENT AND PLAN / ED COURSE  Pertinent labs & imaging results that were available during my care of the patient were reviewed by me and considered in my medical decision making (see chart for details).  Location and exam are inconsistent with diagnosis of gout. Patient states that he wears steel toe boots and walks on concrete floors for 2 jobs. Pain more likely from plantar fasciitis. We will prescribe pain medication and prednisone and have him follow-up with podiatry as an outpatient. He was advised to return to the emergency department for symptoms that change or worsen if he is unable schedule an appointment. ____________________________________________   FINAL CLINICAL IMPRESSION(S) / ED DIAGNOSES  Final diagnoses:  Plantar fasciitis, right      Chinita Pester, FNP 11/26/14 2353  Loleta Rose, MD 11/27/14 0000

## 2015-01-01 ENCOUNTER — Encounter: Payer: Self-pay | Admitting: Emergency Medicine

## 2015-01-01 DIAGNOSIS — Z791 Long term (current) use of non-steroidal anti-inflammatories (NSAID): Secondary | ICD-10-CM | POA: Insufficient documentation

## 2015-01-01 DIAGNOSIS — Z720 Tobacco use: Secondary | ICD-10-CM | POA: Insufficient documentation

## 2015-01-01 DIAGNOSIS — B349 Viral infection, unspecified: Secondary | ICD-10-CM | POA: Insufficient documentation

## 2015-01-01 DIAGNOSIS — J45909 Unspecified asthma, uncomplicated: Secondary | ICD-10-CM | POA: Insufficient documentation

## 2015-01-01 DIAGNOSIS — Z79899 Other long term (current) drug therapy: Secondary | ICD-10-CM | POA: Insufficient documentation

## 2015-01-01 LAB — URINALYSIS COMPLETE WITH MICROSCOPIC (ARMC ONLY)
Bacteria, UA: NONE SEEN
Bilirubin Urine: NEGATIVE
Glucose, UA: NEGATIVE mg/dL
Hgb urine dipstick: NEGATIVE
Ketones, ur: NEGATIVE mg/dL
Nitrite: NEGATIVE
Protein, ur: NEGATIVE mg/dL
Specific Gravity, Urine: 1.025 (ref 1.005–1.030)
pH: 6 (ref 5.0–8.0)

## 2015-01-01 LAB — CBC WITH DIFFERENTIAL/PLATELET
Basophils Absolute: 0.1 10*3/uL (ref 0–0.1)
Basophils Relative: 1 %
Eosinophils Absolute: 0.2 10*3/uL (ref 0–0.7)
Eosinophils Relative: 2 %
HCT: 42.5 % (ref 40.0–52.0)
Hemoglobin: 14.2 g/dL (ref 13.0–18.0)
Lymphocytes Relative: 18 %
Lymphs Abs: 1.9 10*3/uL (ref 1.0–3.6)
MCH: 28.6 pg (ref 26.0–34.0)
MCHC: 33.3 g/dL (ref 32.0–36.0)
MCV: 85.8 fL (ref 80.0–100.0)
Monocytes Absolute: 0.8 10*3/uL (ref 0.2–1.0)
Monocytes Relative: 7 %
Neutro Abs: 7.7 10*3/uL — ABNORMAL HIGH (ref 1.4–6.5)
Neutrophils Relative %: 72 %
Platelets: 300 10*3/uL (ref 150–440)
RBC: 4.96 MIL/uL (ref 4.40–5.90)
RDW: 14.3 % (ref 11.5–14.5)
WBC: 10.7 10*3/uL — ABNORMAL HIGH (ref 3.8–10.6)

## 2015-01-01 LAB — BASIC METABOLIC PANEL
Anion gap: 6 (ref 5–15)
BUN: 12 mg/dL (ref 6–20)
CO2: 28 mmol/L (ref 22–32)
Calcium: 8.9 mg/dL (ref 8.9–10.3)
Chloride: 104 mmol/L (ref 101–111)
Creatinine, Ser: 0.94 mg/dL (ref 0.61–1.24)
GFR calc Af Amer: >60 mL/min (ref >60)
GFR calc non Af Amer: >60 mL/min (ref >60)
Glucose, Bld: 111 mg/dL — ABNORMAL HIGH (ref 65–99)
Potassium: 3.4 mmol/L — ABNORMAL LOW (ref 3.5–5.1)
Sodium: 138 mmol/L (ref 135–145)

## 2015-01-01 LAB — LIPASE, BLOOD: Lipase: 18 U/L — ABNORMAL LOW (ref 22–51)

## 2015-01-01 MED ORDER — GI COCKTAIL ~~LOC~~
30.0000 mL | Freq: Once | ORAL | Status: AC
  Administered 2015-01-01: 30 mL via ORAL
  Filled 2015-01-01: qty 30

## 2015-01-01 NOTE — ED Notes (Signed)
Pt c/o epigastric pain with N/V/D since last evening; last vomited just pta; talking in complete coherent sentences; ambulatory with steady gait; pt says both of his daughters are sick with the same symptoms-diagnosed with virus

## 2015-01-02 ENCOUNTER — Emergency Department
Admission: EM | Admit: 2015-01-02 | Discharge: 2015-01-02 | Disposition: A | Discharge status: Home / Self Care | Payer: Self-pay | Attending: Emergency Medicine | Admitting: Emergency Medicine

## 2015-01-02 DIAGNOSIS — B349 Viral infection, unspecified: Secondary | ICD-10-CM

## 2015-01-02 MED ORDER — GI COCKTAIL ~~LOC~~: 30.0000 mL | ORAL | Status: DC

## 2015-01-02 MED ORDER — DICYCLOMINE HCL 20 MG PO TABS: 20.0000 mg | ORAL_TABLET | Freq: Three times a day (TID) | ORAL | Status: DC | PRN

## 2015-01-02 MED ORDER — RANITIDINE HCL 150 MG PO CAPS: 150.0000 mg | ORAL_CAPSULE | Freq: Two times a day (BID) | ORAL | Status: DC

## 2015-01-02 MED ORDER — ONDANSETRON 8 MG PO TBDP
8.0000 mg | ORAL_TABLET | Freq: Once | ORAL | Status: AC
  Administered 2015-01-02: 8 mg via ORAL
  Filled 2015-01-02: qty 1

## 2015-01-02 MED ORDER — FAMOTIDINE 20 MG PO TABS
40.0000 mg | ORAL_TABLET | Freq: Once | ORAL | Status: AC
  Administered 2015-01-02: 40 mg via ORAL
  Filled 2015-01-02: qty 2

## 2015-01-02 MED ORDER — PROMETHAZINE HCL 25 MG PO TABS: 25.0000 mg | ORAL_TABLET | Freq: Four times a day (QID) | ORAL | Status: DC | PRN

## 2015-01-02 NOTE — ED Provider Notes (Signed)
[facility] Emergency Department Provider Note  ____________________________________________  Time seen: 1:30 AM  I have reviewed the triage vital signs and the nursing notes.   HISTORY  Chief Complaint Nausea; Emesis; Diarrhea; and Abdominal Pain    HPI Jesus Singleton is a 29 y.o. male complaining of nausea vomiting diarrhea and upper abdominal pain since last night. His 2 kids at home and been sick with viral symptoms recently including cough runny nose and fussiness. Patient reports that he has also had some runny nose but predominantly GI symptoms. Hasn't been able to eat much since yesterday because when he eats he gets nauseated and vomits. Denies any worsening abdominal pain with eating. No fevers or chills. No dizziness or syncope, no chest pain or shortness of breath.     Past Medical History  Diagnosis Date  . Asthma   . Gout     There are no active problems to display for this patient.   Past Surgical History  Procedure Laterality Date  . Bilateral leg surgery      Current Outpatient Rx  Name  Route  Sig  Dispense  Refill  . dicyclomine (BENTYL) 20 MG tablet   Oral   Take 1 tablet (20 mg total) by mouth 3 (three) times daily as needed for spasms.   30 tablet   0   . HYDROcodone-acetaminophen (NORCO/VICODIN) 5-325 MG per tablet   Oral   Take 1 tablet by mouth every 4 (four) hours as needed for moderate pain.   12 tablet   0   . naproxen (NAPROSYN) 500 MG tablet   Oral   Take 1 tablet (500 mg total) by mouth 2 (two) times daily with a meal.   14 tablet   0   . predniSONE (STERAPRED UNI-PAK 21 TAB) 10 MG (21) TBPK tablet      Take 6 tablets on day 1 Take 5 tablets on day 2 Take 4 tablets on day 3 Take 3 tablets on day 4 Take 2 tablets on day 5 Take 1 tablet on day 6   21 tablet   0   . promethazine (PHENERGAN) 25 MG tablet   Oral   Take 1 tablet (25 mg total) by mouth every 6 (six) hours as needed for nausea or  vomiting.   15 tablet   0   . ranitidine (ZANTAC) 150 MG capsule   Oral   Take 1 capsule (150 mg total) by mouth 2 (two) times daily.   28 capsule   0   . traMADol (ULTRAM) 50 MG tablet   Oral   Take 1 tablet (50 mg total) by mouth every 8 (eight) hours as needed (Do not drive or operate machinery while taking as can cause drowsiness.).   12 tablet   0     Allergies Shellfish allergy  History reviewed. No pertinent family history.  Social History Social History  Substance Use Topics  . Smoking status: Current Every Day Smoker -- 0.20 packs/day    Types: Cigarettes  . Smokeless tobacco: None  . Alcohol Use: No    Review of Systems  Constitutional: No fever or chills. No weight changes Eyes:No blurry vision or double vision.  ENT: No sore throat. Cardiovascular: No chest pain. Respiratory: No dyspnea or cough. Gastrointestinal: Positive epigastric abdominal pain with nausea vomiting and diarrhea..  No BRBPR or melena. Genitourinary: Negative for dysuria, urinary retention, bloody urine, or difficulty urinating. Musculoskeletal: Negative for back pain. No joint swelling or pain.  Skin: Negative for rash. Neurological: Negative for headaches, focal weakness or numbness. Psychiatric:No anxiety or depression.   Endocrine:No hot/cold intolerance, changes in energy, or sleep difficulty.  10-point ROS otherwise negative.  ____________________________________________   PHYSICAL EXAM:  VITAL SIGNS: ED Triage Vitals  Enc Vitals Group     BP 01/01/15 2056 134/79 mmHg     Pulse Rate 01/01/15 2056 97     Resp 01/01/15 2056 18     Temp 01/01/15 2056 98.2 F (36.8 C)     Temp Source 01/01/15 2056 Oral     SpO2 01/01/15 2056 98 %     Weight 01/01/15 2056 280 lb (127.007 kg)     Height 01/01/15 2056  (1.854 m)     Head Cir --      Peak Flow --      Pain Score 01/01/15 2111 7     Pain Loc --      Pain Edu? --      Excl. in GC? --      Constitutional: Alert  and oriented. Well appearing and in no distress. Eyes: No scleral icterus. No conjunctival pallor. PERRL. EOMI ENT   Head: Normocephalic and atraumatic.   Nose: No congestion/rhinnorhea. No septal hematoma   Mouth/Throat: MMM, moderate pharyngeal erythema. No peritonsillar mass. No uvula shift.   Neck: No stridor. No SubQ emphysema. No meningismus. Hematological/Lymphatic/Immunilogical: No cervical lymphadenopathy. Cardiovascular: RRR. Normal and symmetric distal pulses are present in all extremities. No murmurs, rubs, or gallops. Respiratory: Normal respiratory effort without tachypnea nor retractions. Breath sounds are clear and equal bilaterally. No wheezes/rales/rhonchi. Gastrointestinal: Soft with left upper quadrant and epigastric tenderness. No distention. There is no CVA tenderness.  No rebound, rigidity, or guarding. Genitourinary: deferred Musculoskeletal: Nontender with normal range of motion in all extremities. No joint effusions.  No lower extremity tenderness.  No edema. Neurologic:   Normal speech and language.  CN 2-10 normal. Motor grossly intact. No pronator drift.  Normal gait. No gross focal neurologic deficits are appreciated.  Skin:  Skin is warm, dry and intact. No rash noted.  No petechiae, purpura, or bullae. Psychiatric: Mood and affect are normal. Speech and behavior are normal. Patient exhibits appropriate insight and judgment.  ____________________________________________    LABS (pertinent positives/negatives) (all labs ordered are listed, but only abnormal results are displayed) Labs Reviewed  CBC WITH DIFFERENTIAL/PLATELET - Abnormal; Notable for the following:    WBC 10.7 (*)    Neutro Abs 7.7 (*)    All other components within normal limits  BASIC METABOLIC PANEL - Abnormal; Notable for the following:    Potassium 3.4 (*)    Glucose, Bld 111 (*)    All other components within normal limits  LIPASE, BLOOD - Abnormal; Notable for the  following:    Lipase 18 (*)    All other components within normal limits  URINALYSIS COMPLETEWITH MICROSCOPIC ([facility] ONLY) - Abnormal; Notable for the following:    Color, Urine YELLOW (*)    APPearance CLEAR (*)    Leukocytes, UA TRACE (*)    Squamous Epithelial / LPF 0-5 (*)    All other components within normal limits   ____________________________________________   EKG    ____________________________________________    RADIOLOGY    ____________________________________________   PROCEDURES  ____________________________________________   INITIAL IMPRESSION / ASSESSMENT AND PLAN / ED COURSE  Pertinent labs & imaging results that were available during my care of the patient were reviewed by me and considered in my medical  decision making (see chart for details).  Patient presents with symptoms consistent with viral syndrome especially given the family history. Labs are unremarkable and have low suspicion for biliary pathology or pancreatitis. No evidence of perforation or obstruction. We'll give the patient antiemetics and antacids and antispasmodics for his GI symptoms and counseled the patient on being sure to get lots of rest and drink lots of fluids to maintain adequate hydration. The symptoms can be expected to be self-limited and resolved on its own with symptomatic relief in the meantime. We'll have him follow up with primary care as needed for further management.  ____________________________________________   FINAL CLINICAL IMPRESSION(S) / ED DIAGNOSES  Final diagnoses:  Viral syndrome      Sharman Cheek, MD 01/02/15 [phone]

## 2015-01-02 NOTE — Discharge Instructions (Signed)

## 2015-01-04 ENCOUNTER — Emergency Department
Admission: EM | Admit: 2015-01-04 | Discharge: 2015-01-04 | Disposition: A | Discharge status: Home / Self Care | Payer: Self-pay | Attending: Emergency Medicine | Admitting: Emergency Medicine

## 2015-01-04 ENCOUNTER — Encounter: Payer: Self-pay | Admitting: *Deleted

## 2015-01-04 DIAGNOSIS — Z720 Tobacco use: Secondary | ICD-10-CM | POA: Insufficient documentation

## 2015-01-04 DIAGNOSIS — K529 Noninfective gastroenteritis and colitis, unspecified: Secondary | ICD-10-CM | POA: Insufficient documentation

## 2015-01-04 LAB — COMPREHENSIVE METABOLIC PANEL
ALT: 40 U/L (ref 17–63)
AST: 53 U/L — ABNORMAL HIGH (ref 15–41)
Albumin: 4.1 g/dL (ref 3.5–5.0)
Alkaline Phosphatase: 61 U/L (ref 38–126)
Anion gap: 8 (ref 5–15)
BUN: 13 mg/dL (ref 6–20)
CO2: 25 mmol/L (ref 22–32)
Calcium: 9 mg/dL (ref 8.9–10.3)
Chloride: 105 mmol/L (ref 101–111)
Creatinine, Ser: 0.99 mg/dL (ref 0.61–1.24)
GFR calc Af Amer: >60 mL/min (ref >60)
GFR calc non Af Amer: >60 mL/min (ref >60)
Glucose, Bld: 102 mg/dL — ABNORMAL HIGH (ref 65–99)
Potassium: 3.7 mmol/L (ref 3.5–5.1)
Sodium: 138 mmol/L (ref 135–145)
Total Bilirubin: 0.4 mg/dL (ref 0.3–1.2)
Total Protein: 7.8 g/dL (ref 6.5–8.1)

## 2015-01-04 LAB — CBC
HCT: 43.4 % (ref 40.0–52.0)
Hemoglobin: 14.4 g/dL (ref 13.0–18.0)
MCH: 28.2 pg (ref 26.0–34.0)
MCHC: 33.1 g/dL (ref 32.0–36.0)
MCV: 85.1 fL (ref 80.0–100.0)
Platelets: 290 10*3/uL (ref 150–440)
RBC: 5.1 MIL/uL (ref 4.40–5.90)
RDW: 14.4 % (ref 11.5–14.5)
WBC: 9.9 10*3/uL (ref 3.8–10.6)

## 2015-01-04 LAB — LIPASE, BLOOD: Lipase: 15 U/L — ABNORMAL LOW (ref 22–51)

## 2015-01-04 MED ORDER — FAMOTIDINE 20 MG PO TABS
ORAL_TABLET | ORAL | Status: AC
  Administered 2015-01-04: 20 mg via ORAL
  Filled 2015-01-04: qty 1

## 2015-01-04 MED ORDER — ONDANSETRON 4 MG PO TBDP
ORAL_TABLET | ORAL | Status: AC
  Administered 2015-01-04: 4 mg via ORAL
  Filled 2015-01-04: qty 1

## 2015-01-04 MED ORDER — ONDANSETRON 4 MG PO TBDP
4.0000 mg | ORAL_TABLET | Freq: Three times a day (TID) | ORAL | Status: DC | PRN
Start: 2015-01-04 — End: 2015-03-01

## 2015-01-04 MED ORDER — FAMOTIDINE 20 MG PO TABS: 20.0000 mg | ORAL_TABLET | Freq: Every day | ORAL | Status: DC

## 2015-01-04 MED ORDER — ONDANSETRON 4 MG PO TBDP
4.0000 mg | ORAL_TABLET | Freq: Once | ORAL | Status: AC
  Administered 2015-01-04: 4 mg via ORAL

## 2015-01-04 MED ORDER — FAMOTIDINE 20 MG PO TABS
20.0000 mg | ORAL_TABLET | Freq: Once | ORAL | Status: AC
  Administered 2015-01-04: 20 mg via ORAL

## 2015-01-04 NOTE — ED Notes (Signed)
Pt seen in ER two days ago for GI virus, pt reports increased vomiting with abdominal pain below belly button

## 2015-01-04 NOTE — ED Provider Notes (Signed)
[facility] Emergency Department Provider Note     Time seen: ----------------------------------------- 5:51 PM on 01/04/2015 -----------------------------------------    I have reviewed the triage vital signs and the nursing notes.   HISTORY  Chief Complaint Emesis    HPI Jesus Singleton is a 29 y.o. male who presents ER for abdominal pain and vomiting. Patient states he was seen in the ER 2 days ago for same.Patient states he is still having vomiting, his diarrhea has improved somewhat. Still having some lower abdominal discomfort.   Past Medical History  Diagnosis Date  . Asthma   . Gout     There are no active problems to display for this patient.   Past Surgical History  Procedure Laterality Date  . Bilateral leg surgery      Allergies Shellfish allergy  Social History Social History  Substance Use Topics  . Smoking status: Current Every Day Smoker -- 0.20 packs/day    Types: Cigarettes  . Smokeless tobacco: None  . Alcohol Use: No    Review of Systems Constitutional: Negative for fever. Eyes: Negative for visual changes. ENT: Negative for sore throat. Cardiovascular: Negative for chest pain. Respiratory: Negative for shortness of breath. Gastrointestinal: Positive for abdominal pain, vomiting and diarrhea Genitourinary: Negative for dysuria. Musculoskeletal: Negative for back pain. Skin: Negative for rash. Neurological: Negative for headaches, focal weakness or numbness.  10-point ROS otherwise negative.  ____________________________________________   PHYSICAL EXAM:  VITAL SIGNS: ED Triage Vitals  Enc Vitals Group     BP 01/04/15 1700 123/73 mmHg     Pulse Rate 01/04/15 1700 88     Resp 01/04/15 1700 20     Temp 01/04/15 1700 97.7 F (36.5 C)     Temp Source 01/04/15 1700 Oral     SpO2 01/04/15 1700 97 %     Weight 01/04/15 1700 285 lb (129.275 kg)     Height 01/04/15 1700  (1.854 m)     Head Cir --       Peak Flow --      Pain Score 01/04/15 1702 8     Pain Loc --      Pain Edu? --      Excl. in GC? --     Constitutional: Alert and oriented. Well appearing and in no distress. Eyes: Conjunctivae are normal. PERRL. Normal extraocular movements. ENT   Head: Normocephalic and atraumatic.   Nose: No congestion/rhinnorhea.   Mouth/Throat: Mucous membranes are moist.   Neck: No stridor. Cardiovascular: Normal rate, regular rhythm. Normal and symmetric distal pulses are present in all extremities. No murmurs, rubs, or gallops. Respiratory: Normal respiratory effort without tachypnea nor retractions. Breath sounds are clear and equal bilaterally. No wheezes/rales/rhonchi. Gastrointestinal: Soft and nontender. No distention. No abdominal bruits.  Musculoskeletal: Nontender with normal range of motion in all extremities. No joint effusions.  No lower extremity tenderness nor edema. Neurologic:  Normal speech and language. No gross focal neurologic deficits are appreciated. Speech is normal. No gait instability. Skin:  Skin is warm, dry and intact. No rash noted. Psychiatric: Mood and affect are normal. Speech and behavior are normal. Patient exhibits appropriate insight and judgment.  ____________________________________________  ED COURSE:  Pertinent labs & imaging results that were available during my care of the patient were reviewed by me and considered in my medical decision making (see chart for details). Patient looks well, will change his antibiotics recheck labs. ____________________________________________    LABS (pertinent positives/negatives)  Labs Reviewed  LIPASE, BLOOD - Abnormal; Notable for the following:    Lipase 15 (*)    All other components within normal limits  COMPREHENSIVE METABOLIC PANEL - Abnormal; Notable for the following:    Glucose, Bld 102 (*)    AST 53 (*)    All other components within normal limits  CBC    ____________________________________________  FINAL ASSESSMENT AND PLAN  Gastroenteritis  Plan: Patient with labs and imaging as dictated above. Labs are unremarkable. He continues to have some vomiting and diarrhea but otherwise looks well. He'll be discharged with Zofran and Pepcid to take instead.   Emily Filbert, MD   Emily Filbert, MD 01/04/15 [phone]

## 2015-01-04 NOTE — Discharge Instructions (Signed)

## 2015-01-25 ENCOUNTER — Encounter: Payer: Self-pay | Admitting: Emergency Medicine

## 2015-01-25 ENCOUNTER — Emergency Department
Admission: EM | Admit: 2015-01-25 | Discharge: 2015-01-25 | Disposition: A | Discharge status: Home / Self Care | Payer: Self-pay | Attending: Emergency Medicine | Admitting: Emergency Medicine

## 2015-01-25 DIAGNOSIS — J45901 Unspecified asthma with (acute) exacerbation: Secondary | ICD-10-CM | POA: Insufficient documentation

## 2015-01-25 DIAGNOSIS — J069 Acute upper respiratory infection, unspecified: Secondary | ICD-10-CM | POA: Insufficient documentation

## 2015-01-25 DIAGNOSIS — Z7952 Long term (current) use of systemic steroids: Secondary | ICD-10-CM | POA: Insufficient documentation

## 2015-01-25 DIAGNOSIS — Z79899 Other long term (current) drug therapy: Secondary | ICD-10-CM | POA: Insufficient documentation

## 2015-01-25 DIAGNOSIS — Z720 Tobacco use: Secondary | ICD-10-CM | POA: Insufficient documentation

## 2015-01-25 DIAGNOSIS — R1110 Vomiting, unspecified: Secondary | ICD-10-CM | POA: Insufficient documentation

## 2015-01-25 DIAGNOSIS — Z791 Long term (current) use of non-steroidal anti-inflammatories (NSAID): Secondary | ICD-10-CM | POA: Insufficient documentation

## 2015-01-25 MED ORDER — BENZONATATE 100 MG PO CAPS: ORAL_CAPSULE | ORAL | Status: DC

## 2015-01-25 MED ORDER — PROMETHAZINE HCL 25 MG PO TABS: 25.0000 mg | ORAL_TABLET | Freq: Four times a day (QID) | ORAL | Status: DC | PRN

## 2015-01-25 MED ORDER — ALBUTEROL SULFATE HFA 108 (90 BASE) MCG/ACT IN AERS: 2.0000 | INHALATION_SPRAY | Freq: Four times a day (QID) | RESPIRATORY_TRACT | Status: DC | PRN

## 2015-01-25 NOTE — ED Provider Notes (Signed)
[facility] Emergency Department Provider Note  ____________________________________________  Time seen: Approximately 4:03 PM  I have reviewed the triage vital signs and the nursing notes.   HISTORY  Chief Complaint URI   HPI Jesus Singleton is a 29 y.o. male with complaint of cold symptoms 2 days. He complains of nasal congestion and drainage. Positive for headache. He complains of sore throat with posterior drainage. Patient states that when he coughs he vomited at times. Patient has a history of asthma but hasn't inhaler that he's been using. He is unsure of any fever at home but "felt hot". He states that his son at home has similar symptoms which is the same as what he is experiencing now. He is not taking any medication at this time and does not have a PCP.He rates his pain 9 out of 10.   Past Medical History  Diagnosis Date  . Asthma   . Gout     There are no active problems to display for this patient.   Past Surgical History  Procedure Laterality Date  . Bilateral leg surgery      Current Outpatient Rx  Name  Route  Sig  Dispense  Refill  . albuterol (PROVENTIL HFA;VENTOLIN HFA) 108 (90 BASE) MCG/ACT inhaler   Inhalation   Inhale 2 puffs into the lungs every 6 (six) hours as needed for wheezing or shortness of breath.   1 Inhaler   2   . benzonatate (TESSALON PERLES) 100 MG capsule      Take 1-2 q 8 hours prn cough   30 capsule   0   . dicyclomine (BENTYL) 20 MG tablet   Oral   Take 1 tablet (20 mg total) by mouth 3 (three) times daily as needed for spasms.   30 tablet   0   . famotidine (PEPCID) 20 MG tablet   Oral   Take 1 tablet (20 mg total) by mouth daily.   30 tablet   1   . HYDROcodone-acetaminophen (NORCO/VICODIN) 5-325 MG per tablet   Oral   Take 1 tablet by mouth every 4 (four) hours as needed for moderate pain.   12 tablet   0   . naproxen (NAPROSYN) 500 MG tablet   Oral   Take 1 tablet (500 mg total) by  mouth 2 (two) times daily with a meal.   14 tablet   0   . ondansetron (ZOFRAN ODT) 4 MG disintegrating tablet   Oral   Take 1 tablet (4 mg total) by mouth every 8 (eight) hours as needed for nausea or vomiting.   20 tablet   0   . predniSONE (STERAPRED UNI-PAK 21 TAB) 10 MG (21) TBPK tablet      Take 6 tablets on day 1 Take 5 tablets on day 2 Take 4 tablets on day 3 Take 3 tablets on day 4 Take 2 tablets on day 5 Take 1 tablet on day 6   21 tablet   0   . promethazine (PHENERGAN) 25 MG tablet   Oral   Take 1 tablet (25 mg total) by mouth every 6 (six) hours as needed for nausea or vomiting.   30 tablet   0   . ranitidine (ZANTAC) 150 MG capsule   Oral   Take 1 capsule (150 mg total) by mouth 2 (two) times daily.   28 capsule   0   . traMADol (ULTRAM) 50 MG tablet   Oral   Take 1 tablet (  50 mg total) by mouth every 8 (eight) hours as needed (Do not drive or operate machinery while taking as can cause drowsiness.).   12 tablet   0     Allergies Shellfish allergy  History reviewed. No pertinent family history.  Social History Social History  Substance Use Topics  . Smoking status: Current Every Day Smoker -- 0.20 packs/day    Types: Cigarettes  . Smokeless tobacco: None  . Alcohol Use: No    Review of Systems Constitutional: No fever/chills ENT: Eyes negative sore throat. Cardiovascular: Denies chest pain. Respiratory: Denies shortness of breath. Positive cough Gastrointestinal: No abdominal pain. Eyes negative nausea, positive vomiting.  No diarrhea.  No constipation. Genitourinary: Negative for dysuria. Musculoskeletal: Negative for back pain. Skin: Negative for rash. Neurological: Negative for headaches, focal weakness or numbness.  10-point ROS otherwise negative.  ____________________________________________   PHYSICAL EXAM:  VITAL SIGNS: ED Triage Vitals  Enc Vitals Group     BP 01/25/15 1548 144/75 mmHg     Pulse Rate 01/25/15 1548 92      Resp 01/25/15 1548 18     Temp 01/25/15 1548 98.2 F (36.8 C)     Temp Source 01/25/15 1548 Oral     SpO2 01/25/15 1548 100 %     Weight 01/25/15 1548 270 lb (122.471 kg)     Height 01/25/15 1548  (1.854 m)     Head Cir --      Peak Flow --      Pain Score 01/25/15 1548 9     Pain Loc --      Pain Edu? --      Excl. in GC? --     Constitutional: Alert and oriented. Well appearing and in no acute distress. Eyes: Conjunctivae are normal. PERRL. EOMI. Head: Atraumatic. Nose: No congestion/rhinnorhea. EACs and TMs are clear Mouth/Throat: Mucous membranes are moist.  Oropharynx non-erythematous. Large tonsils bilaterally without any exudate. Moderate posterior drainage seen. Neck: No stridor.   Cardiovascular: Normal rate, regular rhythm. Grossly normal heart sounds.  Good peripheral circulation. Respiratory: Normal respiratory effort.  No retractions. Lungs CTAB. Gastrointestinal: Soft and nontender. No distention. No abdominal bruits. No CVA tenderness. Musculoskeletal: No lower extremity tenderness nor edema.  No joint effusions. Neurologic:  Normal speech and language. No gross focal neurologic deficits are appreciated. No gait instability. Skin:  Skin is warm, dry and intact. No rash noted. Psychiatric: Mood and affect are normal. Speech and behavior are normal.  ____________________________________________   LABS (all labs ordered are listed, but only abnormal results are displayed)  Labs Reviewed - No data to display  PROCEDURES  Procedure(s) performed: None  Critical Care performed: No  ____________________________________________   INITIAL IMPRESSION / ASSESSMENT AND PLAN / ED COURSE  Pertinent labs & imaging results that were available during my care of the patient were reviewed by me and considered in my medical decision making (see chart for details).  Patient is given a prescription for albuterol inhaler, Tessalon Perles 100 mg 1 or 2 3 times a day  when necessary cough. Prescription was written for Phenergan if needed for nausea and he is given a note for 2 days to be out of work as he works in [employer]. ____________________________________________   FINAL CLINICAL IMPRESSION(S) / ED DIAGNOSES  Final diagnoses:  Acute upper respiratory infection      Tommi Rumps, PA-C 01/25/15 1642  Jennye Moccasin, MD 01/25/15 [phone]

## 2015-01-25 NOTE — ED Notes (Signed)
Patient to ED with 2 day history of cold symptoms and cough that is causing him to vomit at times.

## 2015-01-25 NOTE — Discharge Instructions (Signed)
Upper Respiratory Infection, Adult An upper respiratory infection (URI) is also sometimes known as the common cold. The upper respiratory tract includes the nose, sinuses, throat, trachea, and bronchi. Bronchi are the airways leading to the lungs. Most people improve within 1 week, but symptoms can last up to 2 weeks. A residual cough may last even longer.  CAUSES Many different viruses can infect the tissues lining the upper respiratory tract. The tissues become irritated and inflamed and often become very moist. Mucus production is also common. A cold is contagious. You can easily spread the virus to others by oral contact. This includes kissing, sharing a glass, coughing, or sneezing. Touching your mouth or nose and then touching a surface, which is then touched by another person, can also spread the virus. SYMPTOMS  Symptoms typically develop 1 to 3 days after you come in contact with a cold virus. Symptoms vary from person to person. They may include:  Runny nose.  Sneezing.  Nasal congestion.  Sinus irritation.  Sore throat.  Loss of voice (laryngitis).  Cough.  Fatigue.  Muscle aches.  Loss of appetite.  Headache.  Low-grade fever. DIAGNOSIS  You might diagnose your own cold based on familiar symptoms, since most people get a cold 2 to 3 times a year. Your caregiver can confirm this based on your exam. Most importantly, your caregiver can check that your symptoms are not due to another disease such as strep throat, sinusitis, pneumonia, asthma, or epiglottitis. Blood tests, throat tests, and X-rays are not necessary to diagnose a common cold, but they may sometimes be helpful in excluding other more serious diseases. Your caregiver will decide if any further tests are required. RISKS AND COMPLICATIONS  You may be at risk for a more severe case of the common cold if you smoke cigarettes, have chronic heart disease (such as heart failure) or lung disease (such as asthma), or if  you have a weakened immune system. The very young and very old are also at risk for more serious infections. Bacterial sinusitis, middle ear infections, and bacterial pneumonia can complicate the common cold. The common cold can worsen asthma and chronic obstructive pulmonary disease (COPD). Sometimes, these complications can require emergency medical care and may be life-threatening. PREVENTION  The best way to protect against getting a cold is to practice good hygiene. Avoid oral or hand contact with people with cold symptoms. Wash your hands often if contact occurs. There is no clear evidence that vitamin C, vitamin E, echinacea, or exercise reduces the chance of developing a cold. However, it is always recommended to get plenty of rest and practice good nutrition. TREATMENT  Treatment is directed at relieving symptoms. There is no cure. Antibiotics are not effective, because the infection is caused by a virus, not by bacteria. Treatment may include:  Increased fluid intake. Sports drinks offer valuable electrolytes, sugars, and fluids.  Breathing heated mist or steam (vaporizer or shower).  Eating chicken soup or other clear broths, and maintaining good nutrition.  Getting plenty of rest.  Using gargles or lozenges for comfort.  Controlling fevers with ibuprofen or acetaminophen as directed by your caregiver.  Increasing usage of your inhaler if you have asthma. Zinc gel and zinc lozenges, taken in the first 24 hours of the common cold, can shorten the duration and lessen the severity of symptoms. Pain medicines may help with fever, muscle aches, and throat pain. A variety of non-prescription medicines are available to treat congestion and runny nose. Your caregiver   can make recommendations and may suggest nasal or lung inhalers for other symptoms.  HOME CARE INSTRUCTIONS   Only take over-the-counter or prescription medicines for pain, discomfort, or fever as directed by your  caregiver.  Use a warm mist humidifier or inhale steam from a shower to increase air moisture. This may keep secretions moist and make it easier to breathe.  Drink enough water and fluids to keep your urine clear or pale yellow.  Rest as needed.  Return to work when your temperature has returned to normal or as your caregiver advises. You may need to stay home longer to avoid infecting others. You can also use a face mask and careful hand washing to prevent spread of the virus. SEEK MEDICAL CARE IF:   After the first few days, you feel you are getting worse rather than better.  You need your caregiver's advice about medicines to control symptoms.  You develop chills, worsening shortness of breath, or brown or red sputum. These may be signs of pneumonia.  You develop yellow or brown nasal discharge or pain in the face, especially when you bend forward. These may be signs of sinusitis.  You develop a fever, swollen neck glands, pain with swallowing, or white areas in the back of your throat. These may be signs of strep throat. SEEK IMMEDIATE MEDICAL CARE IF:   You have a fever.  You develop severe or persistent headache, ear pain, sinus pain, or chest pain.  You develop wheezing, a prolonged cough, cough up blood, or have a change in your usual mucus (if you have chronic lung disease).  You develop sore muscles or a stiff neck. Document Released: 10/23/2000 Document Revised: 07/22/2011 Document Reviewed: 08/04/2013 [employer] Patient Information 2015 [employer]. This information is not intended to replace advice given to you by your health care provider. Make sure you discuss any questions you have with your health care provider.  

## 2015-03-01 ENCOUNTER — Emergency Department
Admission: EM | Admit: 2015-03-01 | Discharge: 2015-03-01 | Disposition: A | Discharge status: Home / Self Care | Payer: Self-pay | Attending: Emergency Medicine | Admitting: Emergency Medicine

## 2015-03-01 ENCOUNTER — Encounter: Payer: Self-pay | Admitting: *Deleted

## 2015-03-01 DIAGNOSIS — M10071 Idiopathic gout, right ankle and foot: Secondary | ICD-10-CM | POA: Insufficient documentation

## 2015-03-01 DIAGNOSIS — M109 Gout, unspecified: Secondary | ICD-10-CM

## 2015-03-01 DIAGNOSIS — Z720 Tobacco use: Secondary | ICD-10-CM | POA: Insufficient documentation

## 2015-03-01 MED ORDER — HYDROCODONE-ACETAMINOPHEN 5-325 MG PO TABS: 1.0000 | ORAL_TABLET | ORAL | Status: DC | PRN

## 2015-03-01 MED ORDER — INDOMETHACIN 50 MG PO CAPS: 50.0000 mg | ORAL_CAPSULE | Freq: Two times a day (BID) | ORAL | Status: DC

## 2015-03-01 MED ORDER — COLCHICINE 0.6 MG PO TABS: 1.2000 mg | ORAL_TABLET | Freq: Once | ORAL | Status: DC

## 2015-03-01 MED ORDER — KETOROLAC TROMETHAMINE 60 MG/2ML IM SOLN
60.0000 mg | Freq: Once | INTRAMUSCULAR | Status: AC
  Administered 2015-03-01: 60 mg via INTRAMUSCULAR
  Filled 2015-03-01: qty 2

## 2015-03-01 NOTE — ED Notes (Signed)
Pt reports right foot pain reoccurred. Pt states diagnosed with gout previously.

## 2015-03-01 NOTE — ED Provider Notes (Signed)
[facility] Emergency Department Provider Note  ____________________________________________  Time seen: Approximately 8:20 PM  I have reviewed the triage vital signs and the nursing notes.   HISTORY  Chief Complaint Foot Pain    HPI Alvira Mondayaylor L Ganesh is a 29 y.o. male resents for evaluation of right foot great toe pain. Patient states past medical history of gout feels like another exacerbation is coming on. Desires to have some medication for relief so he doesn't miss work.   Past Medical History  Diagnosis Date  . Asthma   . Gout     There are no active problems to display for this patient.   Past Surgical History  Procedure Laterality Date  . Bilateral leg surgery      Current Outpatient Rx  Name  Route  Sig  Dispense  Refill  . albuterol (PROVENTIL HFA;VENTOLIN HFA) 108 (90 BASE) MCG/ACT inhaler   Inhalation   Inhale 2 puffs into the lungs every 6 (six) hours as needed for wheezing or shortness of breath.   1 Inhaler   2   . colchicine 0.6 MG tablet   Oral   Take 2 tablets (1.2 mg total) by mouth once.   3 tablet   0   . HYDROcodone-acetaminophen (NORCO) 5-325 MG tablet   Oral   Take 1-2 tablets by mouth every 4 (four) hours as needed for moderate pain.   15 tablet   0   . indomethacin (INDOCIN) 50 MG capsule   Oral   Take 1 capsule (50 mg total) by mouth 2 (two) times daily with a meal.   30 capsule   0     Allergies Shellfish allergy  No family history on file.  Social History Social History  Substance Use Topics  . Smoking status: Current Every Day Smoker -- 0.20 packs/day    Types: Cigarettes  . Smokeless tobacco: None  . Alcohol Use: No    Review of Systems Constitutional: No fever/chills Eyes: No visual changes. ENT: No sore throat. Cardiovascular: Denies chest pain. Respiratory: Denies shortness of breath. Gastrointestinal: No abdominal pain.  No nausea, no vomiting.  No diarrhea.  No  constipation. Genitourinary: Negative for dysuria. Musculoskeletal: Positive for right great toe pain. Skin: Negative for rash. Neurological: Negative for headaches, focal weakness or numbness. ` 10-point ROS otherwise negative.  ____________________________________________   PHYSICAL EXAM:  VITAL SIGNS: ED Triage Vitals  Enc Vitals Group     BP 03/01/15 1949 148/77 mmHg     Pulse Rate 03/01/15 1949 100     Resp 03/01/15 1949 16     Temp 03/01/15 1949 97.7 F (36.5 C)     Temp Source 03/01/15 1949 Oral     SpO2 03/01/15 1949 96 %     Weight 03/01/15 1949 257 lb (116.574 kg)     Height 03/01/15 1949 6\' 1"  (1.854 m)     Head Cir --      Peak Flow --      Pain Score 03/01/15 1948 9     Pain Loc --      Pain Edu? --      Excl. in GC? --     Constitutional: Alert and oriented. Well appearing and in no acute distress. Cardiovascular: Normal rate, regular rhythm. Grossly normal heart sounds.  Good peripheral circulation. Respiratory: Normal respiratory effort.  No retractions. Lungs CTAB. Musculoskeletal: No lower extremity tenderness nor edema.  No joint effusions. Neurologic:  Normal speech and language. No gross focal neurologic  deficits are appreciated. No gait instability. Skin:  Skin is warm, dry and intact. No rash noted. Psychiatric: Mood and affect are normal. Speech and behavior are normal.  ____________________________________________   LABS (all labs ordered are listed, but only abnormal results are displayed)  Labs Reviewed - No data to display ____________________________________________     PROCEDURES  Procedure(s) performed: None  Critical Care performed: No  ____________________________________________   INITIAL IMPRESSION / ASSESSMENT AND PLAN / ED COURSE  Pertinent labs & imaging results that were available during my care of the patient were reviewed by me and considered in my medical decision making (see chart for details).  Acute  exacerbation of gout. Rx given for Indocin 50 mg, colchicine 0.6 mg, and hydrocodone. Patient follow-up with PCP or return to the ER with any worsening symptomology. 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

## 2015-03-01 NOTE — Discharge Instructions (Signed)

## 2015-03-27 ENCOUNTER — Emergency Department
Admission: EM | Admit: 2015-03-27 | Discharge: 2015-03-27 | Disposition: A | Discharge status: Home / Self Care | Payer: Self-pay | Attending: Emergency Medicine | Admitting: Emergency Medicine

## 2015-03-27 ENCOUNTER — Encounter: Payer: Self-pay | Admitting: Emergency Medicine

## 2015-03-27 DIAGNOSIS — K529 Noninfective gastroenteritis and colitis, unspecified: Secondary | ICD-10-CM | POA: Insufficient documentation

## 2015-03-27 DIAGNOSIS — F17210 Nicotine dependence, cigarettes, uncomplicated: Secondary | ICD-10-CM | POA: Insufficient documentation

## 2015-03-27 DIAGNOSIS — Z79899 Other long term (current) drug therapy: Secondary | ICD-10-CM | POA: Insufficient documentation

## 2015-03-27 LAB — LIPASE, BLOOD: Lipase: 20 U/L (ref 11–51)

## 2015-03-27 LAB — URINALYSIS COMPLETE WITH MICROSCOPIC (ARMC ONLY)
Bacteria, UA: NONE SEEN
Bilirubin Urine: NEGATIVE
Glucose, UA: NEGATIVE mg/dL
Hgb urine dipstick: NEGATIVE
Ketones, ur: NEGATIVE mg/dL
Nitrite: NEGATIVE
Protein, ur: NEGATIVE mg/dL
Specific Gravity, Urine: 1.021 (ref 1.005–1.030)
Squamous Epithelial / LPF: NONE SEEN
pH: 5 (ref 5.0–8.0)

## 2015-03-27 LAB — COMPREHENSIVE METABOLIC PANEL
ALT: 27 U/L (ref 17–63)
AST: 25 U/L (ref 15–41)
Albumin: 4.2 g/dL (ref 3.5–5.0)
Alkaline Phosphatase: 63 U/L (ref 38–126)
Anion gap: 5 (ref 5–15)
BUN: 13 mg/dL (ref 6–20)
CO2: 26 mmol/L (ref 22–32)
Calcium: 9.2 mg/dL (ref 8.9–10.3)
Chloride: 104 mmol/L (ref 101–111)
Creatinine, Ser: 0.9 mg/dL (ref 0.61–1.24)
GFR calc Af Amer: >60 mL/min (ref >60)
GFR calc non Af Amer: >60 mL/min (ref >60)
Glucose, Bld: 104 mg/dL — ABNORMAL HIGH (ref 65–99)
Potassium: 3.8 mmol/L (ref 3.5–5.1)
Sodium: 135 mmol/L (ref 135–145)
Total Bilirubin: 0.6 mg/dL (ref 0.3–1.2)
Total Protein: 8 g/dL (ref 6.5–8.1)

## 2015-03-27 LAB — CBC
HCT: 44.5 % (ref 40.0–52.0)
Hemoglobin: 14.9 g/dL (ref 13.0–18.0)
MCH: 28.5 pg (ref 26.0–34.0)
MCHC: 33.4 g/dL (ref 32.0–36.0)
MCV: 85.4 fL (ref 80.0–100.0)
Platelets: 301 10*3/uL (ref 150–440)
RBC: 5.21 MIL/uL (ref 4.40–5.90)
RDW: 14.5 % (ref 11.5–14.5)
WBC: 10.2 10*3/uL (ref 3.8–10.6)

## 2015-03-27 MED ORDER — ONDANSETRON HCL 4 MG PO TABS: 4.0000 mg | ORAL_TABLET | Freq: Every day | ORAL | Status: DC | PRN

## 2015-03-27 NOTE — ED Notes (Signed)
C/o diarrhea and vomiting past several days

## 2015-03-27 NOTE — ED Notes (Signed)
Pt presents with vomiting and diarrhea since last night, unable to keep anything down.

## 2015-03-27 NOTE — ED Provider Notes (Signed)
[facility] Emergency Department Provider Note  ____________________________________________  Time seen: 1745  I have reviewed the triage vital signs and the nursing notes.   HISTORY  Chief Complaint Emesis   History limited by: Not Limited   HPI Alvira Mondayaylor L Potenza is a 29 y.o. male who presents to the emergency department today with concerns for vomiting and diarrhea. He states that the symptoms started 2 days ago. He has had multiple episodes of vomiting and diarrhea. He has had some mild associated abdominal cramping. He denies any bloody stool. He denies any fevers. He states he has been around sick people recently. He denies any abnormal food intake or recent travel.   Past Medical History  Diagnosis Date  . Asthma   . Gout     There are no active problems to display for this patient.   Past Surgical History  Procedure Laterality Date  . Bilateral leg surgery      Current Outpatient Rx  Name  Route  Sig  Dispense  Refill  . albuterol (PROVENTIL HFA;VENTOLIN HFA) 108 (90 BASE) MCG/ACT inhaler   Inhalation   Inhale 2 puffs into the lungs every 6 (six) hours as needed for wheezing or shortness of breath.   1 Inhaler   2   . colchicine 0.6 MG tablet   Oral   Take 2 tablets (1.2 mg total) by mouth once.   3 tablet   0   . HYDROcodone-acetaminophen (NORCO) 5-325 MG tablet   Oral   Take 1-2 tablets by mouth every 4 (four) hours as needed for moderate pain.   15 tablet   0   . indomethacin (INDOCIN) 50 MG capsule   Oral   Take 1 capsule (50 mg total) by mouth 2 (two) times daily with a meal.   30 capsule   0     Allergies Shellfish allergy  No family history on file.  Social History Social History  Substance Use Topics  . Smoking status: Current Every Day Smoker -- 0.20 packs/day    Types: Cigarettes  . Smokeless tobacco: None  . Alcohol Use: No    Review of Systems  Constitutional: Negative for fever. Cardiovascular:  Negative for chest pain. Respiratory: Negative for shortness of breath. Gastrointestinal: Positive for abdominal cramping, nausea and vomiting. Genitourinary: Negative for dysuria. Musculoskeletal: Negative for back pain. Skin: Negative for rash. Neurological: Negative for headaches, focal weakness or numbness.  10-point ROS otherwise negative.  ____________________________________________   PHYSICAL EXAM:  VITAL SIGNS: ED Triage Vitals  Enc Vitals Group     BP 03/27/15 1651 134/86 mmHg     Pulse Rate 03/27/15 1651 93     Resp 03/27/15 1651 18     Temp 03/27/15 1651 98.5 F (36.9 C)     Temp Source 03/27/15 1651 Oral     SpO2 03/27/15 1651 97 %     Weight 03/27/15 1651 295 lb (133.811 kg)     Height 03/27/15 1651 6' (1.829 m)   Constitutional: Alert and oriented. Well appearing and in no distress. Eyes: Conjunctivae are normal. PERRL. Normal extraocular movements. ENT   Head: Normocephalic and atraumatic.   Nose: No congestion/rhinnorhea.   Mouth/Throat: Mucous membranes are moist.   Neck: No stridor. Hematological/Lymphatic/Immunilogical: No cervical lymphadenopathy. Cardiovascular: Normal rate, regular rhythm.  No murmurs, rubs, or gallops. Respiratory: Normal respiratory effort without tachypnea nor retractions. Breath sounds are clear and equal bilaterally. No wheezes/rales/rhonchi. Gastrointestinal: Soft and nontender. No distention. There is no CVA tenderness.  Genitourinary: Deferred Musculoskeletal: Normal range of motion in all extremities. No joint effusions.  No lower extremity tenderness nor edema. Neurologic:  Normal speech and language. No gross focal neurologic deficits are appreciated.  Skin:  Skin is warm, dry and intact. No rash noted. Psychiatric: Mood and affect are normal. Speech and behavior are normal. Patient exhibits appropriate insight and judgment.  ____________________________________________    LABS (pertinent  positives/negatives)  Labs Reviewed  COMPREHENSIVE METABOLIC PANEL - Abnormal; Notable for the following:    Glucose, Bld 104 (*)    All other components within normal limits  URINALYSIS COMPLETEWITH MICROSCOPIC ([facility] ONLY) - Abnormal; Notable for the following:    Color, Urine YELLOW (*)    APPearance CLEAR (*)    Leukocytes, UA TRACE (*)    All other components within normal limits  URINE CULTURE  LIPASE, BLOOD  CBC     ____________________________________________   EKG  None  ____________________________________________    RADIOLOGY  None   ____________________________________________   PROCEDURES  Procedure(s) performed: None  Critical Care performed: No  ____________________________________________   INITIAL IMPRESSION / ASSESSMENT AND PLAN / ED COURSE  Pertinent labs & imaging results that were available during my care of the patient were reviewed by me and considered in my medical decision making (see chart for details).  Patient presented with nausea and vomiting, no concerning abdominal pain. Physical exam is benign. Blood work without any concerning findings. Urine did show some white blood cells of her patient was not complaining of any dysuria. I have ordered a urine culture. Will discharge home with antiemetics.  ____________________________________________   FINAL CLINICAL IMPRESSION(S) / ED DIAGNOSES  Final diagnoses:  Gastroenteritis     Phineas Semen, MD 03/27/15 [phone]

## 2015-03-27 NOTE — Discharge Instructions (Signed)
Please seek medical attention for any high fevers, chest pain, shortness of breath, change in behavior, persistent vomiting, bloody stool or any other new or concerning symptoms. ° °Viral Gastroenteritis °Viral gastroenteritis is also known as stomach flu. This condition affects the stomach and intestinal tract. It can cause sudden diarrhea and vomiting. The illness typically lasts 3 to 8 days. Most people develop an immune response that eventually gets rid of the virus. While this natural response develops, the virus can make you quite ill. °CAUSES  °Many different viruses can cause gastroenteritis, such as rotavirus or noroviruses. You can catch one of these viruses by consuming contaminated food or water. You may also catch a virus by sharing utensils or other personal items with an infected person or by touching a contaminated surface. °SYMPTOMS  °The most common symptoms are diarrhea and vomiting. These problems can cause a severe loss of body fluids (dehydration) and a body salt (electrolyte) imbalance. Other symptoms may include: °· Fever. °· Headache. °· Fatigue. °· Abdominal pain. °DIAGNOSIS  °Your caregiver can usually diagnose viral gastroenteritis based on your symptoms and a physical exam. A stool sample may also be taken to test for the presence of viruses or other infections. °TREATMENT  °This illness typically goes away on its own. Treatments are aimed at rehydration. The most serious cases of viral gastroenteritis involve vomiting so severely that you are not able to keep fluids down. In these cases, fluids must be given through an intravenous line (IV). °HOME CARE INSTRUCTIONS  °· Drink enough fluids to keep your urine clear or pale yellow. Drink small amounts of fluids frequently and increase the amounts as tolerated. °· Ask your caregiver for specific rehydration instructions. °· Avoid: °¨ Foods high in sugar. °¨ Alcohol. °¨ Carbonated drinks. °¨ Tobacco. °¨ Juice. °¨ Caffeine  drinks. °¨ Extremely hot or cold fluids. °¨ Fatty, greasy foods. °¨ Too much intake of anything at one time. °¨ Dairy products until 24 to 48 hours after diarrhea stops. °· You may consume probiotics. Probiotics are active cultures of beneficial bacteria. They may lessen the amount and number of diarrheal stools in adults. Probiotics can be found in yogurt with active cultures and in supplements. °· Wash your hands well to avoid spreading the virus. °· Only take over-the-counter or prescription medicines for pain, discomfort, or fever as directed by your caregiver. Do not give aspirin to children. Antidiarrheal medicines are not recommended. °· Ask your caregiver if you should continue to take your regular prescribed and over-the-counter medicines. °· Keep all follow-up appointments as directed by your caregiver. °SEEK IMMEDIATE MEDICAL CARE IF:  °· You are unable to keep fluids down. °· You do not urinate at least once every 6 to 8 hours. °· You develop shortness of breath. °· You notice blood in your stool or vomit. This may look like coffee grounds. °· You have abdominal pain that increases or is concentrated in one small area (localized). °· You have persistent vomiting or diarrhea. °· You have a fever. °· The patient is a child younger than 3 months, and he or she has a fever. °· The patient is a child older than 3 months, and he or she has a fever and persistent symptoms. °· The patient is a child older than 3 months, and he or she has a fever and symptoms suddenly get worse. °· The patient is a baby, and he or she has no tears when crying. °MAKE SURE YOU:  °· Understand these instructions. °·   Will watch your condition. °· Will get help right away if you are not doing well or get worse. °  °This information is not intended to replace advice given to you by your health care provider. Make sure you discuss any questions you have with your health care provider. °  °Document Released: 04/29/2005 Document Revised:  07/22/2011 Document Reviewed: 02/13/2011 °Elsevier Interactive Patient Education ©2016 [employer] ° °

## 2015-04-04 ENCOUNTER — Emergency Department
Admission: EM | Admit: 2015-04-04 | Discharge: 2015-04-04 | Disposition: A | Discharge status: Home / Self Care | Payer: Self-pay | Attending: Emergency Medicine | Admitting: Emergency Medicine

## 2015-04-04 DIAGNOSIS — F17210 Nicotine dependence, cigarettes, uncomplicated: Secondary | ICD-10-CM | POA: Insufficient documentation

## 2015-04-04 DIAGNOSIS — Z79899 Other long term (current) drug therapy: Secondary | ICD-10-CM | POA: Insufficient documentation

## 2015-04-04 DIAGNOSIS — M10071 Idiopathic gout, right ankle and foot: Secondary | ICD-10-CM | POA: Insufficient documentation

## 2015-04-04 DIAGNOSIS — M109 Gout, unspecified: Secondary | ICD-10-CM

## 2015-04-04 MED ORDER — OXYCODONE-ACETAMINOPHEN 5-325 MG PO TABS
1.0000 | ORAL_TABLET | Freq: Once | ORAL | Status: AC
  Administered 2015-04-04: 1 via ORAL
  Filled 2015-04-04: qty 1

## 2015-04-04 MED ORDER — INDOMETHACIN 50 MG PO CAPS: 50.0000 mg | ORAL_CAPSULE | Freq: Three times a day (TID) | ORAL | Status: DC | PRN

## 2015-04-04 MED ORDER — COLCHICINE 0.6 MG PO TABS
1.2000 mg | ORAL_TABLET | Freq: Once | ORAL | Status: AC
  Administered 2015-04-04: 1.2 mg via ORAL
  Filled 2015-04-04: qty 2

## 2015-04-04 MED ORDER — INDOMETHACIN 50 MG PO CAPS
50.0000 mg | ORAL_CAPSULE | Freq: Once | ORAL | Status: AC
  Administered 2015-04-04: 50 mg via ORAL
  Filled 2015-04-04 (×2): qty 1

## 2015-04-04 NOTE — ED Provider Notes (Signed)
[facility] Emergency Department Provider Note  ____________________________________________  Time seen: 2:50 AM  I have reviewed the triage vital signs and the nursing notes.   HISTORY  Chief Complaint Gout      HPI Jesus Singleton is a 29 y.o. male presents with right great toe pain is currently 9 out of 10. Patient denies any fever. Patient states symptoms consistent with previous episodes of gout     Past Medical History  Diagnosis Date  . Asthma   . Gout     There are no active problems to display for this patient.   Past Surgical History  Procedure Laterality Date  . Bilateral leg surgery      Current Outpatient Rx  Name  Route  Sig  Dispense  Refill  . albuterol (PROVENTIL HFA;VENTOLIN HFA) 108 (90 BASE) MCG/ACT inhaler   Inhalation   Inhale 2 puffs into the lungs every 6 (six) hours as needed for wheezing or shortness of breath.   1 Inhaler   2   . colchicine 0.6 MG tablet   Oral   Take 2 tablets (1.2 mg total) by mouth once.   3 tablet   0   . HYDROcodone-acetaminophen (NORCO) 5-325 MG tablet   Oral   Take 1-2 tablets by mouth every 4 (four) hours as needed for moderate pain.   15 tablet   0   . indomethacin (INDOCIN) 50 MG capsule   Oral   Take 1 capsule (50 mg total) by mouth 2 (two) times daily with a meal.   30 capsule   0   . ondansetron (ZOFRAN) 4 MG tablet   Oral   Take 1 tablet (4 mg total) by mouth daily as needed for nausea or vomiting.   20 tablet   0     Allergies Shellfish allergy  No family history on file.  Social History Social History  Substance Use Topics  . Smoking status: Current Every Day Smoker -- 0.20 packs/day    Types: Cigarettes  . Smokeless tobacco: Not on file  . Alcohol Use: No    Review of Systems  Constitutional: Negative for fever. Eyes: Negative for visual changes. ENT: Negative for sore throat. Cardiovascular: Negative for chest pain. Respiratory: Negative  for shortness of breath. Gastrointestinal: Negative for abdominal pain, vomiting and diarrhea. Genitourinary: Negative for dysuria. Musculoskeletal: Negative for back pain. Positive for right great toe pain Skin: Negative for rash. Neurological: Negative for headaches, focal weakness or numbness.   10-point ROS otherwise negative.  ____________________________________________   PHYSICAL EXAM:  VITAL SIGNS: ED Triage Vitals  Enc Vitals Group     BP 04/04/15 0243 149/92 mmHg     Pulse Rate 04/04/15 0243 108     Resp 04/04/15 0243 18     Temp 04/04/15 0243 98 F (36.7 C)     Temp Source 04/04/15 0243 Oral     SpO2 04/04/15 0243 97 %     Weight 04/04/15 0243 309 lb (140.161 kg)     Height 04/04/15 0243  (1.854 m)     Head Cir --      Peak Flow --      Pain Score 04/04/15 0243 9     Pain Loc --      Pain Edu? --      Excl. in GC? --      Constitutional: Alert and oriented. Well appearing and in no distress. Eyes: Conjunctivae are normal. PERRL. Normal extraocular movements. ENT  Head: Normocephalic and atraumatic.   Nose: No congestion/rhinnorhea.   Mouth/Throat: Mucous membranes are moist.   Neck: No stridor. Hematological/Lymphatic/Immunilogical: No cervical lymphadenopathy. Cardiovascular: Normal rate, regular rhythm. Normal and symmetric distal pulses are present in all extremities. No murmurs, rubs, or gallops. Respiratory: Normal respiratory effort without tachypnea nor retractions. Breath sounds are clear and equal bilaterally. No wheezes/rales/rhonchi. Gastrointestinal: Soft and nontender. No distention. There is no CVA tenderness. Genitourinary: deferred Musculoskeletal: Nontender with normal range of motion in all extremities. No joint effusions. Right first CMC joint tender to touch warm to touch  Neurologic:  Normal speech and language. No gross focal neurologic deficits are appreciated. Speech is normal.  Skin:  Skin is warm, dry and intact.  No rash noted. Warmth to the [facility] joint of the right great toe      INITIAL IMPRESSION / ASSESSMENT AND PLAN / ED COURSE  Pertinent labs & imaging results that were available during my care of the patient were reviewed by me and considered in my medical decision making (see chart for details).  Patient received indomethacin and Colchicine .  ____________________________________________   FINAL CLINICAL IMPRESSION(S) / ED DIAGNOSES  Final diagnoses:  Acute gout of right foot, unspecified cause      [name], MD 04/04/15 0403

## 2015-04-04 NOTE — Discharge Instructions (Signed)

## 2015-04-04 NOTE — ED Notes (Signed)
Pt in with co gout pain to right foot, hx of the same.

## 2015-04-07 ENCOUNTER — Encounter: Payer: Self-pay | Admitting: Emergency Medicine

## 2015-04-07 ENCOUNTER — Emergency Department
Admission: EM | Admit: 2015-04-07 | Discharge: 2015-04-07 | Disposition: A | Discharge status: Home / Self Care | Payer: Self-pay | Attending: Emergency Medicine | Admitting: Emergency Medicine

## 2015-04-07 DIAGNOSIS — F17210 Nicotine dependence, cigarettes, uncomplicated: Secondary | ICD-10-CM | POA: Insufficient documentation

## 2015-04-07 DIAGNOSIS — K649 Unspecified hemorrhoids: Secondary | ICD-10-CM | POA: Insufficient documentation

## 2015-04-07 DIAGNOSIS — K5900 Constipation, unspecified: Secondary | ICD-10-CM | POA: Insufficient documentation

## 2015-04-07 DIAGNOSIS — F419 Anxiety disorder, unspecified: Secondary | ICD-10-CM | POA: Insufficient documentation

## 2015-04-07 DIAGNOSIS — Z79899 Other long term (current) drug therapy: Secondary | ICD-10-CM | POA: Insufficient documentation

## 2015-04-07 LAB — COMPREHENSIVE METABOLIC PANEL
ALT: 28 U/L (ref 17–63)
AST: 24 U/L (ref 15–41)
Albumin: 3.7 g/dL (ref 3.5–5.0)
Alkaline Phosphatase: 58 U/L (ref 38–126)
Anion gap: 4 — ABNORMAL LOW (ref 5–15)
BUN: 16 mg/dL (ref 6–20)
CO2: 27 mmol/L (ref 22–32)
Calcium: 9 mg/dL (ref 8.9–10.3)
Chloride: 106 mmol/L (ref 101–111)
Creatinine, Ser: 1.11 mg/dL (ref 0.61–1.24)
GFR calc Af Amer: >60 mL/min (ref >60)
GFR calc non Af Amer: >60 mL/min (ref >60)
Glucose, Bld: 103 mg/dL — ABNORMAL HIGH (ref 65–99)
Potassium: 3.8 mmol/L (ref 3.5–5.1)
Sodium: 137 mmol/L (ref 135–145)
Total Bilirubin: 0.6 mg/dL (ref 0.3–1.2)
Total Protein: 7.2 g/dL (ref 6.5–8.1)

## 2015-04-07 LAB — URINALYSIS COMPLETE WITH MICROSCOPIC (ARMC ONLY)
Bacteria, UA: NONE SEEN
Bilirubin Urine: NEGATIVE
Glucose, UA: NEGATIVE mg/dL
Hgb urine dipstick: NEGATIVE
Ketones, ur: NEGATIVE mg/dL
Leukocytes, UA: NEGATIVE
Nitrite: NEGATIVE
Protein, ur: NEGATIVE mg/dL
Specific Gravity, Urine: 1.021 (ref 1.005–1.030)
pH: 6 (ref 5.0–8.0)

## 2015-04-07 LAB — CBC
HCT: 42.6 % (ref 40.0–52.0)
Hemoglobin: 14.5 g/dL (ref 13.0–18.0)
MCH: 28.9 pg (ref 26.0–34.0)
MCHC: 34.1 g/dL (ref 32.0–36.0)
MCV: 84.9 fL (ref 80.0–100.0)
Platelets: 274 10*3/uL (ref 150–440)
RBC: 5.01 MIL/uL (ref 4.40–5.90)
RDW: 14.4 % (ref 11.5–14.5)
WBC: 10.7 10*3/uL — ABNORMAL HIGH (ref 3.8–10.6)

## 2015-04-07 LAB — LIPASE, BLOOD: Lipase: 22 U/L (ref 11–51)

## 2015-04-07 MED ORDER — DOCUSATE SODIUM 100 MG PO CAPS: 100.0000 mg | ORAL_CAPSULE | Freq: Two times a day (BID) | ORAL | Status: AC

## 2015-04-07 NOTE — ED Provider Notes (Signed)
[facility] Emergency Department Provider Note  ____________________________________________  Time seen: 4 PM  I have reviewed the triage vital signs and the nursing notes.   HISTORY  Chief Complaint Constipation    HPI Alvira Mondayaylor L Stejskal is a 29 y.o. male who presents with complaints of constipation for 6 or 7 days. He denies abdominal pain but complains of abdominal fullness. He also noted when trying to bowel movement that something was protruding from his rectum. He denies rectal bleeding. No nausea or vomiting     Past Medical History  Diagnosis Date  . Asthma   . Gout     There are no active problems to display for this patient.   Past Surgical History  Procedure Laterality Date  . Bilateral leg surgery      Current Outpatient Rx  Name  Route  Sig  Dispense  Refill  . albuterol (PROVENTIL HFA;VENTOLIN HFA) 108 (90 BASE) MCG/ACT inhaler   Inhalation   Inhale 2 puffs into the lungs every 6 (six) hours as needed for wheezing or shortness of breath.   1 Inhaler   2   . colchicine 0.6 MG tablet   Oral   Take 2 tablets (1.2 mg total) by mouth once.   3 tablet   0   . docusate sodium (COLACE) 100 MG capsule   Oral   Take 1 capsule (100 mg total) by mouth 2 (two) times daily.   60 capsule   2   . HYDROcodone-acetaminophen (NORCO) 5-325 MG tablet   Oral   Take 1-2 tablets by mouth every 4 (four) hours as needed for moderate pain.   15 tablet   0   . indomethacin (INDOCIN) 50 MG capsule   Oral   Take 1 capsule (50 mg total) by mouth 2 (two) times daily with a meal.   30 capsule   0   . indomethacin (INDOCIN) 50 MG capsule   Oral   Take 1 capsule (50 mg total) by mouth 3 (three) times daily as needed.   15 capsule   0   . ondansetron (ZOFRAN) 4 MG tablet   Oral   Take 1 tablet (4 mg total) by mouth daily as needed for nausea or vomiting.   20 tablet   0     Allergies Shellfish allergy  No family history on  file.  Social History Social History  Substance Use Topics  . Smoking status: Current Every Day Smoker -- 0.20 packs/day    Types: Cigarettes  . Smokeless tobacco: None  . Alcohol Use: No    Review of Systems  Constitutional: Negative for fever. Eyes: Negative for discharge ENT: Negative for sore throat Cardiovascular: Negative for chest pain. Respiratory: Negative for shortness of breath. Gastrointestinal: Negative for vomiting Genitourinary: Negative for dysuria. Musculoskeletal: Negative for back pain. Skin: Negative for rash. Neurological: Negative for headaches  Psychiatric: Positive for anxiety    ____________________________________________   PHYSICAL EXAM:  VITAL SIGNS: ED Triage Vitals  Enc Vitals Group     BP 04/07/15 1513 135/75 mmHg     Pulse Rate 04/07/15 1513 93     Resp 04/07/15 1513 18     Temp 04/07/15 1513 98.2 F (36.8 C)     Temp Source 04/07/15 1513 Oral     SpO2 04/07/15 1513 98 %     Weight 04/07/15 1513 300 lb (136.079 kg)     Height 04/07/15 1513 6\' 1"  (1.854 m)     Head Cir --  Peak Flow --      Pain Score 04/07/15 1514 10     Pain Loc --      Pain Edu? --      Excl. in GC? --      Constitutional: Alert and oriented. Well appearing and in no distress. Eyes: Conjunctivae are normal.  ENT   Head: Normocephalic and atraumatic.   Mouth/Throat: Mucous membranes are moist.  Respiratory: Normal respiratory effort without tachypnea nor retractions.  Gastrointestinal: Soft and non-tender in all quadrants. No distention. There is no CVA tenderness. Positive hemorrhoid at 6:00, nonbleeding, nontender Genitourinary: deferred Musculoskeletal: normal range of motion in all extremities.  Neurologic:  Normal speech and language. No gross focal neurologic deficits are appreciated. Skin:  Skin is warm, dry and intact. No rash noted. Psychiatric: Mood and affect are normal. Patient exhibits appropriate insight and  judgment.  ____________________________________________    LABS (pertinent positives/negatives)  Labs Reviewed  COMPREHENSIVE METABOLIC PANEL - Abnormal; Notable for the following:    Glucose, Bld 103 (*)    Anion gap 4 (*)    All other components within normal limits  CBC - Abnormal; Notable for the following:    WBC 10.7 (*)    All other components within normal limits  URINALYSIS COMPLETEWITH MICROSCOPIC ([facility] ONLY) - Abnormal; Notable for the following:    Color, Urine YELLOW (*)    APPearance CLEAR (*)    Squamous Epithelial / LPF 0-5 (*)    All other components within normal limits  LIPASE, BLOOD    ____________________________________________   EKG  None  ____________________________________________    RADIOLOGY I have personally reviewed any xrays that were ordered on this patient: None  ____________________________________________   PROCEDURES  Procedure(s) performed: none  Critical Care performed: none  ____________________________________________   INITIAL IMPRESSION / ASSESSMENT AND PLAN / ED COURSE  Pertinent labs & imaging results that were available during my care of the patient were reviewed by me and considered in my medical decision making (see chart for details).  Patient well-appearing in no distress. Benign abdominal exam. Positive nonbleeding hemorrhoid. Patient reassured, recommend supportive care including Preparation H. Colace Rx  ____________________________________________   FINAL CLINICAL IMPRESSION(S) / ED DIAGNOSES  Final diagnoses:  Hemorrhoids, unspecified hemorrhoid type  Constipation, unspecified constipation type     Jene Every, MD 04/07/15 1625

## 2015-04-07 NOTE — Discharge Instructions (Signed)
Constipation, Adult Constipation is when a person has fewer than three bowel movements a week, has difficulty having a bowel movement, or has stools that are dry, hard, or larger than normal. As people grow older, constipation is more common. A low-fiber diet, not taking in enough fluids, and taking certain medicines may make constipation worse.  CAUSES   Certain medicines, such as antidepressants, pain medicine, iron supplements, antacids, and water pills.   Certain diseases, such as diabetes, irritable bowel syndrome (IBS), thyroid disease, or depression.   Not drinking enough water.   Not eating enough fiber-rich foods.   Stress or travel.   Lack of physical activity or exercise.   Ignoring the urge to have a bowel movement.   Using laxatives too much.  SIGNS AND SYMPTOMS   Having fewer than three bowel movements a week.   Straining to have a bowel movement.   Having stools that are hard, dry, or larger than normal.   Feeling full or bloated.   Pain in the lower abdomen.   Not feeling relief after having a bowel movement.  DIAGNOSIS  Your health care provider will take a medical history and perform a physical exam. Further testing may be done for severe constipation. Some tests may include:  A barium enema X-ray to examine your rectum, colon, and, sometimes, your small intestine.   A sigmoidoscopy to examine your lower colon.   A colonoscopy to examine your entire colon. TREATMENT  Treatment will depend on the severity of your constipation and what is causing it. Some dietary treatments include drinking more fluids and eating more fiber-rich foods. Lifestyle treatments may include regular exercise. If these diet and lifestyle recommendations do not help, your health care provider may recommend taking over-the-counter laxative medicines to help you have bowel movements. Prescription medicines may be prescribed if over-the-counter medicines do not work.    HOME CARE INSTRUCTIONS   Eat foods that have a lot of fiber, such as fruits, vegetables, whole grains, and beans.  Limit foods high in fat and processed sugars, such as french fries, hamburgers, cookies, candies, and soda.   A fiber supplement may be added to your diet if you cannot get enough fiber from foods.   Drink enough fluids to keep your urine clear or pale yellow.   Exercise regularly or as directed by your health care provider.   Go to the restroom when you have the urge to go. Do not hold it.   Only take over-the-counter or prescription medicines as directed by your health care provider. Do not take other medicines for constipation without talking to your health care provider first.  SEEK IMMEDIATE MEDICAL CARE IF:   You have bright red blood in your stool.   Your constipation lasts for more than 4 days or gets worse.   You have abdominal or rectal pain.   You have thin, pencil-like stools.   You have unexplained weight loss. MAKE SURE YOU:   Understand these instructions.  Will watch your condition.  Will get help right away if you are not doing well or get worse.   This information is not intended to replace advice given to you by your health care provider. Make sure you discuss any questions you have with your health care provider.   Document Released: 01/26/2004 Document Revised: 05/20/2014 Document Reviewed: 02/08/2013 Elsevier Interactive Patient Education 2016 [employer].  Hemorrhoids Hemorrhoids are puffy (swollen) veins around the rectum or anus. Hemorrhoids can cause pain, itching, bleeding, or irritation.  HOME CARE  Eat foods with fiber, such as whole grains, beans, nuts, fruits, and vegetables. Ask your doctor about taking products with added fiber in them (fibersupplements).  Drink enough fluid to keep your pee (urine) clear or pale yellow.  Exercise often.  Go to the bathroom when you have the urge to poop. Do not  wait.  Avoid straining to poop (bowel movement).  Keep the butt area dry and clean. Use wet toilet paper or moist paper towels.  Medicated creams and medicine inserted into the anus (anal suppository) may be used or applied as told.  Only take medicine as told by your doctor.  Take a warm water bath (sitz bath) for 15-20 minutes to ease pain. Do this 3-4 times a day.  Place ice packs on the area if it is tender or puffy. Use the ice packs between the warm water baths.  Put ice in a plastic bag.  Place a towel between your skin and the bag.  Leave the ice on for 15-20 minutes, 03-04 times a day.  Do not use a donut-shaped pillow or sit on the toilet for a long time. GET HELP RIGHT AWAY IF:   You have more pain that is not controlled by treatment or medicine.  You have bleeding that will not stop.  You have trouble or are unable to poop (bowel movement).  You have pain or puffiness outside the area of the hemorrhoids. MAKE SURE YOU:   Understand these instructions.  Will watch your condition.  Will get help right away if you are not doing well or get worse.   This information is not intended to replace advice given to you by your health care provider. Make sure you discuss any questions you have with your health care provider.   Document Released: 02/06/2008 Document Revised: 04/15/2012 Document Reviewed: 03/10/2012 Elsevier Interactive Patient Education 2016 Elsevier Inc.  High-Fiber Diet Fiber, also called dietary fiber, is a type of carbohydrate found in fruits, vegetables, whole grains, and beans. A high-fiber diet can have many health benefits. Your health care provider may recommend a high-fiber diet to help:  Prevent constipation. Fiber can make your bowel movements more regular.  Lower your cholesterol.  Relieve hemorrhoids, uncomplicated diverticulosis, or irritable bowel syndrome.  Prevent overeating as part of a weight-loss plan.  Prevent heart disease,  type 2 diabetes, and certain cancers. WHAT IS MY PLAN? The recommended daily intake of fiber includes:  38 grams for men under age 5.  30 grams for men over age 12.  25 grams for women under age 37.  21 grams for women over age 68. You can get the recommended daily intake of dietary fiber by eating a variety of fruits, vegetables, grains, and beans. Your health care provider may also recommend a fiber supplement if it is not possible to get enough fiber through your diet. WHAT DO I NEED TO KNOW ABOUT A HIGH-FIBER DIET?  Fiber supplements have not been widely studied for their effectiveness, so it is better to get fiber through food sources.  Always check the fiber content on thenutrition facts label of any prepackaged food. Look for foods that contain at least 5 grams of fiber per serving.  Ask your dietitian if you have questions about specific foods that are related to your condition, especially if those foods are not listed in the following section.  Increase your daily fiber consumption gradually. Increasing your intake of dietary fiber too quickly may cause bloating, cramping, or gas.  Drink plenty of water. Water helps you to digest fiber. WHAT FOODS CAN I EAT? Grains Whole-grain breads. Multigrain cereal. Oats and oatmeal. Brown rice. Barley. Bulgur wheat. Millet. Bran muffins. Popcorn. Rye wafer crackers. Vegetables Sweet potatoes. Spinach. Kale. Artichokes. Cabbage. Broccoli. Green peas. Carrots. Squash. Fruits Berries. Pears. Apples. Oranges. Avocados. Prunes and raisins. Dried figs. Meats and Other Protein Sources Navy, kidney, pinto, and soy beans. Split peas. Lentils. Nuts and seeds. Dairy Fiber-fortified yogurt. Beverages Fiber-fortified soy milk. Fiber-fortified orange juice. Other Fiber bars. The items listed above may not be a complete list of recommended foods or beverages. Contact your dietitian for more options. WHAT FOODS ARE NOT  RECOMMENDED? Grains White bread. Pasta made with refined flour. White rice. Vegetables Fried potatoes. Canned vegetables. Well-cooked vegetables.  Fruits Fruit juice. Cooked, strained fruit. Meats and Other Protein Sources Fatty cuts of meat. Fried Environmental education officerpoultry or fried fish. Dairy Milk. Yogurt. Cream cheese. Sour cream. Beverages Soft drinks. Other Cakes and pastries. Butter and oils. The items listed above may not be a complete list of foods and beverages to avoid. Contact your dietitian for more information. WHAT ARE SOME TIPS FOR INCLUDING HIGH-FIBER FOODS IN MY DIET?  Eat a wide variety of high-fiber foods.  Make sure that half of all grains consumed each day are whole grains.  Replace breads and cereals made from refined flour or white flour with whole-grain breads and cereals.  Replace white rice with brown rice, bulgur wheat, or millet.  Start the day with a breakfast that is high in fiber, such as a cereal that contains at least 5 grams of fiber per serving.  Use beans in place of meat in soups, salads, or pasta.  Eat high-fiber snacks, such as berries, raw vegetables, nuts, or popcorn.   This information is not intended to replace advice given to you by your health care provider. Make sure you discuss any questions you have with your health care provider.   Document Released: 04/29/2005 Document Revised: 05/20/2014 Document Reviewed: 10/12/2013 Elsevier Interactive Patient Education [employer] Elsevier Inc.

## 2015-04-07 NOTE — ED Notes (Signed)
AaoX3.  SKIN WARM AND DRY.  NAD.  AMBULATES WITH EASY AND STEADY GAIT.

## 2015-04-07 NOTE — ED Notes (Signed)
Pt reports difficulty defecating this week, reports last normal BM was last Sunday. Pt also reports rectal pain. Pt describing possible hemorrhoid. Pt reports lower abdominal pain.

## 2015-09-05 ENCOUNTER — Emergency Department
Admission: EM | Admit: 2015-09-05 | Discharge: 2015-09-05 | Disposition: A | Discharge status: Home / Self Care | Payer: Self-pay | Attending: Emergency Medicine | Admitting: Emergency Medicine

## 2015-09-05 DIAGNOSIS — M10071 Idiopathic gout, right ankle and foot: Secondary | ICD-10-CM

## 2015-09-05 DIAGNOSIS — J45909 Unspecified asthma, uncomplicated: Secondary | ICD-10-CM | POA: Insufficient documentation

## 2015-09-05 DIAGNOSIS — F17210 Nicotine dependence, cigarettes, uncomplicated: Secondary | ICD-10-CM | POA: Insufficient documentation

## 2015-09-05 MED ORDER — OXYCODONE-ACETAMINOPHEN 5-325 MG PO TABS
1.0000 | ORAL_TABLET | Freq: Once | ORAL | Status: AC
  Administered 2015-09-05: 1 via ORAL
  Filled 2015-09-05: qty 1

## 2015-09-05 MED ORDER — OXYCODONE HCL 5 MG PO TABS: 5.0000 mg | ORAL_TABLET | Freq: Four times a day (QID) | ORAL | Status: DC | PRN

## 2015-09-05 MED ORDER — COLCHICINE 0.6 MG PO TABS: 1.2000 mg | ORAL_TABLET | Freq: Once | ORAL | Status: DC

## 2015-09-05 MED ORDER — COLCHICINE 0.6 MG PO TABS
1.8000 mg | ORAL_TABLET | Freq: Once | ORAL | Status: AC
  Administered 2015-09-05: 1.8 mg via ORAL
  Filled 2015-09-05: qty 3

## 2015-09-05 NOTE — ED Notes (Addendum)
Patient ambulatory to triage with steady gait, without difficulty or distress noted; pt reports right foot swelling & pain x week; hx gout; taking prednisone, hydrocodone & [facility]

## 2015-09-05 NOTE — Discharge Instructions (Signed)

## 2015-09-05 NOTE — ED Provider Notes (Signed)
CSN: [id number]     Arrival date & time 09/05/15  2258 History   First MD Initiated Contact with Patient 09/05/15 2321     Chief Complaint  Patient presents with  . Foot Pain     (Consider location/radiation/quality/duration/timing/severity/associated sxs/prior Treatment) HPI  30 year old male presents to emergency department for evaluation of right foot pain at the first MTP joint. Patient has a history of gout, last flareup was less than 6 months ago, he had his first MTP joint aspirated confirming gout. Patient was seen at [facility] on 08/31/2015, had an x-ray of the right ankle that showed no effusion. ESR and CBC were normal. Patient was treated for gout with colchicine, prednisone, oxycodone. Over the last 5 days, patient has seen a 50% decrease and swelling and redness. His pain is more localized to the first MTP joint. Today, he feels as if the pain has flared up around the first MTP joint. He denies any fevers. She describes the pain as sharp and painful to light touch. He denies any fevers trauma or injury. He is currently finishing a prednisone taper and has ran out of oxycodone. He does have an appointment with PCP coming up.  Past Medical History  Diagnosis Date  . Asthma   . Gout    Past Surgical History  Procedure Laterality Date  . Bilateral leg surgery     No family history on file. Social History  Substance Use Topics  . Smoking status: Current Every Day Smoker -- 0.20 packs/day    Types: Cigarettes  . Smokeless tobacco: Not on file  . Alcohol Use: No    Review of Systems  Constitutional: Negative.  Negative for fever, chills, activity change and appetite change.  HENT: Negative for congestion, ear pain, mouth sores, rhinorrhea, sinus pressure, sore throat and trouble swallowing.   Eyes: Negative for photophobia, pain and discharge.  Respiratory: Negative for cough, chest tightness and shortness of breath.   Cardiovascular: Negative for chest pain and leg swelling.   Gastrointestinal: Negative for nausea, vomiting, abdominal pain, diarrhea and abdominal distention.  Genitourinary: Negative for dysuria and difficulty urinating.  Musculoskeletal: Positive for arthralgias. Negative for back pain and gait problem.  Skin: Negative for color change and rash.  Neurological: Negative for dizziness and headaches.  Hematological: Negative for adenopathy.  Psychiatric/Behavioral: Negative for behavioral problems and agitation.      Allergies  Shellfish allergy  Home Medications   Prior to Admission medications   Medication Sig Start Date End Date Taking? Authorizing Provider  albuterol (PROVENTIL HFA;VENTOLIN HFA) 108 (90 BASE) MCG/ACT inhaler Inhale 2 puffs into the lungs every 6 (six) hours as needed for wheezing or shortness of breath. 01/25/15   Johnn Hai, PA-C  colchicine 0.6 MG tablet Take 2 tablets (1.2 mg total) by mouth once. 03/01/15   Pierce Crane Beers, PA-C  docusate sodium (COLACE) 100 MG capsule Take 1 capsule (100 mg total) by mouth 2 (two) times daily. 04/07/15 04/06/16  Lavonia Drafts, MD  HYDROcodone-acetaminophen (NORCO) 5-325 MG tablet Take 1-2 tablets by mouth every 4 (four) hours as needed for moderate pain. 03/01/15   Arlyss Repress, PA-C  indomethacin (INDOCIN) 50 MG capsule Take 1 capsule (50 mg total) by mouth 2 (two) times daily with a meal. 03/01/15   Pierce Crane Beers, PA-C  indomethacin (INDOCIN) 50 MG capsule Take 1 capsule (50 mg total) by mouth 3 (three) times daily as needed. 04/04/15   Gregor Hams, MD  ondansetron (ZOFRAN) 4 MG  tablet Take 1 tablet (4 mg total) by mouth daily as needed for nausea or vomiting. 03/27/15   Nance Pear, MD  oxyCODONE (ROXICODONE) 5 MG immediate release tablet Take 1 tablet (5 mg total) by mouth every 6 (six) hours as needed. 09/05/15 09/04/16  Duanne Guess, PA-C   BP 151/86 mmHg  Pulse 107  Temp(Src) 99 F (37.2 C) (Oral)  Resp 20  Ht _0  (1.854 m)  Wt 124.739 kg  BMI 36.29  kg/m2  SpO2 97% Physical Exam  Constitutional: He is oriented to person, place, and time. He appears well-developed and well-nourished.  HENT:  Head: Normocephalic and atraumatic.  Eyes: Conjunctivae and EOM are normal. Pupils are equal, round, and reactive to light.  Neck: Normal range of motion. Neck supple.  Cardiovascular: Normal rate, regular rhythm, normal heart sounds and intact distal pulses.   Pulmonary/Chest: Effort normal and breath sounds normal. No respiratory distress. He has no wheezes. He has no rales.  Musculoskeletal:  Examination of the right ankle shows patient has no erythema warmth or effusion. No tenderness palpation. He has good ankle plantarflexion and dorsiflexion with no discomfort. Right foot shows patient has tenderness and erythema along the first MTP joint. He is tender to touch. No significant swelling or effusion. There is no skin breakdown, drainage noted.  Neurological: He is alert and oriented to person, place, and time. Coordination normal.  Skin: Skin is warm and dry. There is erythema.  Psychiatric: He has a normal mood and affect. His behavior is normal. Judgment and thought content normal.    ED Course  Procedures (including critical care time) Labs Review Labs Reviewed - No data to display  Imaging Review No results found. I have personally reviewed and evaluated these images and lab results as part of my medical decision-making.   EKG Interpretation None      MDM   Final diagnoses:  Acute idiopathic gout of right foot    30 year old male with a history of gout diagnosed with aspiration. Had a flareup 5 days ago, treated at [facility], saw significant improvement with swelling and redness with colchicine, prednisone, oxycodone. Today, noticed re-flareup of the first MTP joint. Patient is placed back on culture seen 1.2 mg 1, 0.6 mg one hour later as needed. He will also finish out prednisone taper and he is given refill of oxycodone. Patient  will follow-up with PCP. He is given strict instructions to follow-up as needed for any increase in pain, swelling, warmth, redness, fevers.   Duanne Guess, PA-C 09/05/15 2352  Earleen Newport, MD 09/06/15 [phone]

## 2015-09-05 NOTE — ED Notes (Addendum)
Pt c/o RT foot pain since last week , hx of gout, area to great toe swollen and painful

## 2016-01-01 ENCOUNTER — Emergency Department
Admission: EM | Admit: 2016-01-01 | Discharge: 2016-01-01 | Disposition: A | Discharge status: Home / Self Care | Payer: Self-pay | Attending: Emergency Medicine | Admitting: Emergency Medicine

## 2016-01-01 ENCOUNTER — Encounter: Payer: Self-pay | Admitting: Emergency Medicine

## 2016-01-01 DIAGNOSIS — K529 Noninfective gastroenteritis and colitis, unspecified: Secondary | ICD-10-CM | POA: Insufficient documentation

## 2016-01-01 DIAGNOSIS — Z791 Long term (current) use of non-steroidal anti-inflammatories (NSAID): Secondary | ICD-10-CM | POA: Insufficient documentation

## 2016-01-01 DIAGNOSIS — F17210 Nicotine dependence, cigarettes, uncomplicated: Secondary | ICD-10-CM | POA: Insufficient documentation

## 2016-01-01 DIAGNOSIS — R109 Unspecified abdominal pain: Secondary | ICD-10-CM | POA: Insufficient documentation

## 2016-01-01 DIAGNOSIS — J45909 Unspecified asthma, uncomplicated: Secondary | ICD-10-CM | POA: Insufficient documentation

## 2016-01-01 LAB — URINALYSIS COMPLETE WITH MICROSCOPIC (ARMC ONLY)
Bacteria, UA: NONE SEEN
Bilirubin Urine: NEGATIVE
Glucose, UA: NEGATIVE mg/dL
Hgb urine dipstick: NEGATIVE
Ketones, ur: NEGATIVE mg/dL
Nitrite: NEGATIVE
Protein, ur: NEGATIVE mg/dL
Specific Gravity, Urine: 1.005 (ref 1.005–1.030)
pH: 6 (ref 5.0–8.0)

## 2016-01-01 LAB — COMPREHENSIVE METABOLIC PANEL
ALT: 30 U/L (ref 17–63)
AST: 27 U/L (ref 15–41)
Albumin: 4.4 g/dL (ref 3.5–5.0)
Alkaline Phosphatase: 62 U/L (ref 38–126)
Anion gap: 6 (ref 5–15)
BUN: 13 mg/dL (ref 6–20)
CO2: 28 mmol/L (ref 22–32)
Calcium: 9.3 mg/dL (ref 8.9–10.3)
Chloride: 106 mmol/L (ref 101–111)
Creatinine, Ser: 1.01 mg/dL (ref 0.61–1.24)
GFR calc Af Amer: >60 mL/min (ref >60)
GFR calc non Af Amer: >60 mL/min (ref >60)
Glucose, Bld: 92 mg/dL (ref 65–99)
Potassium: 3.9 mmol/L (ref 3.5–5.1)
Sodium: 140 mmol/L (ref 135–145)
Total Bilirubin: 0.9 mg/dL (ref 0.3–1.2)
Total Protein: 8 g/dL (ref 6.5–8.1)

## 2016-01-01 LAB — CBC
HCT: 43.9 % (ref 40.0–52.0)
Hemoglobin: 14.9 g/dL (ref 13.0–18.0)
MCH: 29.1 pg (ref 26.0–34.0)
MCHC: 33.9 g/dL (ref 32.0–36.0)
MCV: 85.9 fL (ref 80.0–100.0)
Platelets: 279 10*3/uL (ref 150–440)
RBC: 5.11 MIL/uL (ref 4.40–5.90)
RDW: 14.3 % (ref 11.5–14.5)
WBC: 12.1 10*3/uL — ABNORMAL HIGH (ref 3.8–10.6)

## 2016-01-01 LAB — LIPASE, BLOOD: Lipase: 17 U/L (ref 11–51)

## 2016-01-01 MED ORDER — ONDANSETRON HCL 4 MG PO TABS: 4.0000 mg | ORAL_TABLET | Freq: Three times a day (TID) | ORAL | 0 refills | Status: DC | PRN

## 2016-01-01 NOTE — ED Provider Notes (Signed)
[facility] Emergency Department Provider Note    ____________________________________________   I have reviewed the triage vital signs and the nursing notes.   HISTORY  Chief Complaint Abdominal Pain and Nausea   History limited by: Not Limited   HPI Jesus Singleton is a 30 y.o. male who presents to the emergency department today because of concerns for nausea and vomiting. His son had similar symptoms yesterday. The patient woke up in the middle of last night had multiple episodes of vomiting. It continued throughout the day. He denies seeing any blood in his vomit. He did have associated diarrhea this morning. He states that now he has been having some left upper quadrant pain. He denies any fevers.    Past Medical History:  Diagnosis Date  . Asthma   . Gout     There are no active problems to display for this patient.   Past Surgical History:  Procedure Laterality Date  . bilateral leg surgery      Prior to Admission medications   Medication Sig Start Date End Date Taking? Authorizing Provider  albuterol (PROVENTIL HFA;VENTOLIN HFA) 108 (90 BASE) MCG/ACT inhaler Inhale 2 puffs into the lungs every 6 (six) hours as needed for wheezing or shortness of breath. 01/25/15   Tommi Rumpshonda L Summers, PA-C  colchicine 0.6 MG tablet Take 2 tablets (1.2 mg total) by mouth once. 03/01/15   Charmayne Sheerharles M Beers, PA-C  docusate sodium (COLACE) 100 MG capsule Take 1 capsule (100 mg total) by mouth 2 (two) times daily. 04/07/15 04/06/16  Jene Everyobert Kinner, MD  HYDROcodone-acetaminophen (NORCO) 5-325 MG tablet Take 1-2 tablets by mouth every 4 (four) hours as needed for moderate pain. 03/01/15   Evangeline Dakinharles M Beers, PA-C  indomethacin (INDOCIN) 50 MG capsule Take 1 capsule (50 mg total) by mouth 2 (two) times daily with a meal. 03/01/15   Charmayne Sheerharles M Beers, PA-C  indomethacin (INDOCIN) 50 MG capsule Take 1 capsule (50 mg total) by mouth 3 (three) times daily as needed. 04/04/15    Darci Currentandolph N Brown, MD  ondansetron (ZOFRAN) 4 MG tablet Take 1 tablet (4 mg total) by mouth daily as needed for nausea or vomiting. 03/27/15   Phineas SemenGraydon Shatina Streets, MD  oxyCODONE (ROXICODONE) 5 MG immediate release tablet Take 1 tablet (5 mg total) by mouth every 6 (six) hours as needed. 09/05/15 09/04/16  Evon Slackhomas C Gaines, PA-C    Allergies Shellfish allergy  No family history on file.  Social History Social History  Substance Use Topics  . Smoking status: Current Every Day Smoker    Packs/day: 0.20    Types: Cigarettes  . Smokeless tobacco: Never Used  . Alcohol use No    Review of Systems  Constitutional: Negative for fever. Cardiovascular: Negative for chest pain. Respiratory: Negative for shortness of breath. Gastrointestinal: Positive for nausea vomiting diarrhea and abdominal Neurological: Negative for headaches, focal weakness or numbness.   10-point ROS otherwise negative.  ____________________________________________   PHYSICAL EXAM:  VITAL SIGNS: ED Triage Vitals [01/01/16 1751]  Enc Vitals Group     BP 127/73     Pulse      Resp 20     Temp 98.2 F (36.8 C)     Temp Source Oral     SpO2 99 %     Weight 270 lb (122.5 kg)     Height 6\' 2"  (1.88 m)     Head Circumference      Peak Flow      Pain Score  6   Constitutional: Alert and oriented. Well appearing and in no distress. Eyes: Conjunctivae are normal. PERRL. Normal extraocular movements. ENT   Head: Normocephalic and atraumatic.   Nose: No congestion/rhinnorhea.   Mouth/Throat: Mucous membranes are moist.   Neck: No stridor. Hematological/Lymphatic/Immunilogical: No cervical lymphadenopathy. Cardiovascular: Normal rate, regular rhythm.  No murmurs, rubs, or gallops. Respiratory: Normal respiratory effort without tachypnea nor retractions. Breath sounds are clear and equal bilaterally. No wheezes/rales/rhonchi. Gastrointestinal: Soft and nontender. No distention.  Genitourinary:  Deferred Musculoskeletal: Normal range of motion in all extremities. No joint effusions.  No lower extremity tenderness nor edema. Neurologic:  Normal speech and language. No gross focal neurologic deficits are appreciated.  Skin:  Skin is warm, dry and intact. No rash noted. Psychiatric: Mood and affect are normal. Speech and behavior are normal. Patient exhibits appropriate insight and judgment.  ____________________________________________    LABS (pertinent positives/negatives)  Labs Reviewed  CBC - Abnormal; Notable for the following:       Result Value   WBC 12.1 (*)    All other components within normal limits  URINALYSIS COMPLETEWITH MICROSCOPIC ([facility] ONLY) - Abnormal; Notable for the following:    Color, Urine STRAW (*)    APPearance CLEAR (*)    Leukocytes, UA TRACE (*)    Squamous Epithelial / LPF 0-5 (*)    All other components within normal limits  URINE CULTURE  LIPASE, BLOOD  COMPREHENSIVE METABOLIC PANEL     ____________________________________________   EKG  None  ____________________________________________    RADIOLOGY  None  ____________________________________________   PROCEDURES  Procedures  ____________________________________________   INITIAL IMPRESSION / ASSESSMENT AND PLAN / ED COURSE  Pertinent labs & imaging results that were available during my care of the patient were reviewed by me and considered in my medical decision making (see chart for details).  Patient presented to the emergency department with concern for nausea and vomiting. Think likely patient with gastroenteritis given sick contact and benign exam. Blood work with mild elevation of WBC. Will discharge with antiemetic.   Additionally some WBCs in urine, no dysuria, however will send culture.  ____________________________________________   FINAL CLINICAL IMPRESSION(S) / ED DIAGNOSES  Final diagnoses:  Gastroenteritis     Note: This dictation was prepared  with Dragon dictation. Any transcriptional errors that result from this process are unintentional    Phineas SemenGraydon Alfreida Steffenhagen, MD 01/01/16 1850

## 2016-01-01 NOTE — ED Notes (Signed)
Pt reports that his son had a stomach virus and this am pt began to have left lower abd pain with nausea and vomiting (5 times in the last 24 hours) - pt states diarrhea (2 loose stools this am)

## 2016-01-01 NOTE — Discharge Instructions (Signed)
Please seek medical attention for any high fevers, chest pain, shortness of breath, change in behavior, persistent vomiting, bloody stool or any other new or concerning symptoms.  

## 2016-01-01 NOTE — ED Triage Notes (Signed)
Pt with abd pain , n/v/d started this am, son had GI virus with same sx at home.

## 2016-01-03 LAB — URINE CULTURE: Culture: <10000 — AB

## 2016-01-24 ENCOUNTER — Emergency Department
Admission: EM | Admit: 2016-01-24 | Discharge: 2016-01-25 | Disposition: A | Discharge status: Home / Self Care | Payer: Self-pay | Attending: Emergency Medicine | Admitting: Emergency Medicine

## 2016-01-24 ENCOUNTER — Encounter: Payer: Self-pay | Admitting: Emergency Medicine

## 2016-01-24 DIAGNOSIS — F17210 Nicotine dependence, cigarettes, uncomplicated: Secondary | ICD-10-CM | POA: Insufficient documentation

## 2016-01-24 DIAGNOSIS — Z79899 Other long term (current) drug therapy: Secondary | ICD-10-CM | POA: Insufficient documentation

## 2016-01-24 DIAGNOSIS — M109 Gout, unspecified: Secondary | ICD-10-CM | POA: Insufficient documentation

## 2016-01-24 DIAGNOSIS — J45909 Unspecified asthma, uncomplicated: Secondary | ICD-10-CM | POA: Insufficient documentation

## 2016-01-24 MED ORDER — PREDNISONE 20 MG PO TABS: 40.0000 mg | ORAL_TABLET | Freq: Every day | ORAL | 0 refills | Status: DC

## 2016-01-24 MED ORDER — COLCHICINE 0.6 MG PO TABS
1.2000 mg | ORAL_TABLET | Freq: Once | ORAL | Status: AC
  Administered 2016-01-24: 1.2 mg via ORAL
  Filled 2016-01-24: qty 2

## 2016-01-24 MED ORDER — OXYCODONE-ACETAMINOPHEN 5-325 MG PO TABS
1.0000 | ORAL_TABLET | Freq: Once | ORAL | Status: AC
  Administered 2016-01-24: 1 via ORAL
  Filled 2016-01-24: qty 1

## 2016-01-24 MED ORDER — HYDROCODONE-ACETAMINOPHEN 5-325 MG PO TABS: 1.0000 | ORAL_TABLET | Freq: Four times a day (QID) | ORAL | 0 refills | Status: DC | PRN

## 2016-01-24 MED ORDER — PREDNISONE 20 MG PO TABS
60.0000 mg | ORAL_TABLET | Freq: Once | ORAL | Status: AC
  Administered 2016-01-24: 60 mg via ORAL
  Filled 2016-01-24: qty 3

## 2016-01-24 NOTE — ED Notes (Signed)
Pt reports hx of gout. Pt c/o gout of right foot. Pt has redness/tenderness to right medial foot. Foot is normal temp. 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 Pt reports he normally takes colchicine and oxycodone, however ran out of both medications yesterday

## 2016-01-24 NOTE — ED Provider Notes (Signed)
[facility]-EMERGENCY DEPARTMENT Provider Note   CSN: [id number] Arrival date & time: 01/24/16  2125     History   Chief Complaint Chief Complaint  Patient presents with  . Foot Pain    HPI Jesus Singleton is a 30 y.o. male presents to the emergency department for evaluation of gout flareup to the right great toe. Patient has a history of gout. Patient states 1.5 days ago he developed severe pain swelling and redness to the right great toe. Pain is 10 out of 10 and increased with weightbearing activities as well as with touch. He denies any trauma or injury. He has not had any medications for the pain. Has responded well to colchicine and oral steroids in the past. He denies any fevers.  HPI  Past Medical History:  Diagnosis Date  . Asthma   . Gout     There are no active problems to display for this patient.   Past Surgical History:  Procedure Laterality Date  . bilateral leg surgery         Home Medications    Prior to Admission medications   Medication Sig Start Date End Date Taking? Authorizing Provider  albuterol (PROVENTIL HFA;VENTOLIN HFA) 108 (90 BASE) MCG/ACT inhaler Inhale 2 puffs into the lungs every 6 (six) hours as needed for wheezing or shortness of breath. 01/25/15   Tommi Rumps, PA-C  colchicine 0.6 MG tablet Take 2 tablets (1.2 mg total) by mouth once. 03/01/15   Charmayne Sheer Beers, PA-C  docusate sodium (COLACE) 100 MG capsule Take 1 capsule (100 mg total) by mouth 2 (two) times daily. 04/07/15 04/06/16  Jene Every, MD  HYDROcodone-acetaminophen (NORCO) 5-325 MG tablet Take 1 tablet by mouth every 6 (six) hours as needed for moderate pain. MAXIMUM TOTAL ACETAMINOPHEN DOSE IS 4000 MG PER DAY 01/24/16   Evon Slack, PA-C  indomethacin (INDOCIN) 50 MG capsule Take 1 capsule (50 mg total) by mouth 2 (two) times daily with a meal. 03/01/15   Charmayne Sheer Beers, PA-C  indomethacin (INDOCIN) 50 MG capsule Take 1 capsule (50 mg total) by mouth 3 (three) times  daily as needed. 04/04/15   Darci Current, MD  ondansetron (ZOFRAN) 4 MG tablet Take 1 tablet (4 mg total) by mouth daily as needed for nausea or vomiting. 03/27/15   Phineas Semen, MD  ondansetron (ZOFRAN) 4 MG tablet Take 1 tablet (4 mg total) by mouth every 8 (eight) hours as needed for nausea or vomiting. 01/01/16   Phineas Semen, MD  oxyCODONE (ROXICODONE) 5 MG immediate release tablet Take 1 tablet (5 mg total) by mouth every 6 (six) hours as needed. 09/05/15 09/04/16  Evon Slack, PA-C  predniSONE (DELTASONE) 20 MG tablet Take 2 tablets (40 mg total) by mouth daily. 01/24/16   Evon Slack, PA-C    Family History No family history on file.  Social History Social History  Substance Use Topics  . Smoking status: Current Every Day Smoker    Packs/day: 0.20    Types: Cigarettes  . Smokeless tobacco: Never Used  . Alcohol use No     Allergies   Shellfish allergy   Review of Systems Review of Systems  Constitutional: Negative.  Negative for activity change, appetite change, chills and fever.  HENT: Negative for congestion, ear pain, mouth sores, rhinorrhea, sinus pressure, sore throat and trouble swallowing.   Eyes: Negative for photophobia, pain and discharge.  Respiratory: Negative for cough, chest tightness and shortness of breath.  Cardiovascular: Negative for chest pain and leg swelling.  Gastrointestinal: Negative for abdominal distention, abdominal pain, diarrhea, nausea and vomiting.  Genitourinary: Negative for difficulty urinating and dysuria.  Musculoskeletal: Positive for arthralgias. Negative for back pain and gait problem.  Skin: Negative for color change and rash.  Neurological: Negative for dizziness and headaches.  Hematological: Negative for adenopathy.  Psychiatric/Behavioral: Negative for agitation and behavioral problems.     Physical Exam Updated Vital Signs BP (!) 141/76 (BP Location: Left Arm)   Pulse (!) 102   Temp 98.1 F (36.7 C)  (Oral)   Resp 20   Ht 6\' 1"  (1.854 m)   Wt 129.3 kg   SpO2 97%   BMI 37.60 kg/m   Physical Exam  Constitutional: He appears well-developed and well-nourished.  HENT:  Head: Normocephalic and atraumatic.  Eyes: Conjunctivae are normal.  Neck: Neck supple.  Cardiovascular: Normal rate and intact distal pulses.   Pulmonary/Chest: Effort normal.  Musculoskeletal:  Examination of the right foot shows patient has no swelling. Right great toe first MTP joint there is moderate swelling, redness and warmth. Patient is tender to palpation. There is no fluctuance or drainage.  Neurological: He is alert.  Skin: Skin is warm and dry.  Psychiatric: He has a normal mood and affect.  Nursing note and vitals reviewed.    ED Treatments / Results  Labs (all labs ordered are listed, but only abnormal results are displayed) Labs Reviewed - No data to display  EKG  EKG Interpretation None       Radiology No results found.  Procedures Procedures (including critical care time)  Medications Ordered in ED Medications  colchicine tablet 1.2 mg (not administered)  predniSONE (DELTASONE) tablet 60 mg (not administered)  oxyCODONE-acetaminophen (PERCOCET/ROXICET) 5-325 MG per tablet 1 tablet (not administered)     Initial Impression / Assessment and Plan / ED Course  I have reviewed the triage vital signs and the nursing notes.  Pertinent labs & imaging results that were available during my care of the patient were reviewed by me and considered in my medical decision making (see chart for details).  Clinical Course    30 year old male with acute flareup of gout to the right great toe. His given colchicine, prednisone, oxycodone tablet. He is sent home with prednisone, Norco. He will follow-up with PCP for color [facility]. Return to the ER for any worsening symptoms urgent changes in his health.  Final Clinical Impressions(s) / ED Diagnoses   Final diagnoses:  Acute gout of  right foot, unspecified cause    New Prescriptions New Prescriptions   HYDROCODONE-ACETAMINOPHEN (NORCO) 5-325 MG TABLET    Take 1 tablet by mouth every 6 (six) hours as needed for moderate pain. MAXIMUM TOTAL ACETAMINOPHEN DOSE IS 4000 MG PER DAY   PREDNISONE (DELTASONE) 20 MG TABLET    Take 2 tablets (40 mg total) by mouth daily.     Evon Slackhomas C Erinn Huskins, PA-C 01/24/16 2337    Arnaldo NatalPaul F Malinda, MD 02/02/16 1311

## 2016-01-24 NOTE — ED Triage Notes (Signed)
Patient ambulatory to triage with steady gait, without difficulty or distress noted; pt reports right foot pain since yesterday with hx gout

## 2016-03-12 ENCOUNTER — Emergency Department
Admission: EM | Admit: 2016-03-12 | Discharge: 2016-03-12 | Disposition: A | Discharge status: Home / Self Care | Payer: Self-pay | Attending: Emergency Medicine | Admitting: Emergency Medicine

## 2016-03-12 ENCOUNTER — Encounter: Payer: Self-pay | Admitting: Emergency Medicine

## 2016-03-12 DIAGNOSIS — J45909 Unspecified asthma, uncomplicated: Secondary | ICD-10-CM | POA: Insufficient documentation

## 2016-03-12 DIAGNOSIS — Z79899 Other long term (current) drug therapy: Secondary | ICD-10-CM | POA: Insufficient documentation

## 2016-03-12 DIAGNOSIS — R112 Nausea with vomiting, unspecified: Secondary | ICD-10-CM | POA: Insufficient documentation

## 2016-03-12 DIAGNOSIS — F17210 Nicotine dependence, cigarettes, uncomplicated: Secondary | ICD-10-CM | POA: Insufficient documentation

## 2016-03-12 LAB — COMPREHENSIVE METABOLIC PANEL
ALT: 26 U/L (ref 17–63)
AST: 23 U/L (ref 15–41)
Albumin: 3.9 g/dL (ref 3.5–5.0)
Alkaline Phosphatase: 58 U/L (ref 38–126)
Anion gap: 5 (ref 5–15)
BUN: 17 mg/dL (ref 6–20)
CO2: 25 mmol/L (ref 22–32)
Calcium: 8.6 mg/dL — ABNORMAL LOW (ref 8.9–10.3)
Chloride: 106 mmol/L (ref 101–111)
Creatinine, Ser: 1.12 mg/dL (ref 0.61–1.24)
GFR calc Af Amer: >60 mL/min (ref >60)
GFR calc non Af Amer: >60 mL/min (ref >60)
Glucose, Bld: 118 mg/dL — ABNORMAL HIGH (ref 65–99)
Potassium: 3.9 mmol/L (ref 3.5–5.1)
Sodium: 136 mmol/L (ref 135–145)
Total Bilirubin: 0.3 mg/dL (ref 0.3–1.2)
Total Protein: 7.3 g/dL (ref 6.5–8.1)

## 2016-03-12 LAB — CBC
HCT: 43.4 % (ref 40.0–52.0)
Hemoglobin: 14.7 g/dL (ref 13.0–18.0)
MCH: 29 pg (ref 26.0–34.0)
MCHC: 33.8 g/dL (ref 32.0–36.0)
MCV: 85.9 fL (ref 80.0–100.0)
Platelets: 271 10*3/uL (ref 150–440)
RBC: 5.06 MIL/uL (ref 4.40–5.90)
RDW: 14.3 % (ref 11.5–14.5)
WBC: 9.1 10*3/uL (ref 3.8–10.6)

## 2016-03-12 LAB — LIPASE, BLOOD: Lipase: 25 U/L (ref 11–51)

## 2016-03-12 MED ORDER — SODIUM CHLORIDE 0.9 % IV SOLN
8.0000 mg | Freq: Once | INTRAVENOUS | Status: AC
  Administered 2016-03-12: 8 mg via INTRAVENOUS
  Filled 2016-03-12: qty 4

## 2016-03-12 MED ORDER — ONDANSETRON 4 MG PO TBDP: 4.0000 mg | ORAL_TABLET | Freq: Four times a day (QID) | ORAL | 0 refills | Status: DC | PRN

## 2016-03-12 MED ORDER — SODIUM CHLORIDE 0.9 % IV BOLUS (SEPSIS)
1000.0000 mL | Freq: Once | INTRAVENOUS | Status: AC
  Administered 2016-03-12: 1000 mL via INTRAVENOUS

## 2016-03-12 NOTE — ED Triage Notes (Signed)
Felt bad yesterday  Positive nausea yesterday  Developed vomiting early this am    Also some diarrhea  Unsure of fever  Last time vomited was about 4 am

## 2016-03-12 NOTE — ED Provider Notes (Signed)
[facility] Emergency Department Provider Note   ____________________________________________   First MD Initiated Contact with Patient 03/12/16 [phone]     (approximate)  I have reviewed the triage vital signs and the nursing notes.   HISTORY  Chief Complaint Emesis    HPI Jesus Singleton is a 30 y.o. male reports healthy aside from a history of asthma  Yesterday patient reports he started vomiting after eating pizza. He had 2 episodes of loose stool throughout the evening, that has continued to feel nauseated and as though he wants to vomit.  He presently denies being any pain other than the pain from "throwing up"  Present reports no pain, feeling nauseated but no further vomiting. No fevers or chills. Specifically denies any pain in the right lower abdomen.  Past Medical History:  Diagnosis Date  . Asthma   . Gout     There are no active problems to display for this patient.   Past Surgical History:  Procedure Laterality Date  . bilateral leg surgery      Prior to Admission medications   Medication Sig Start Date End Date Taking? Authorizing Provider  albuterol (PROVENTIL HFA;VENTOLIN HFA) 108 (90 BASE) MCG/ACT inhaler Inhale 2 puffs into the lungs every 6 (six) hours as needed for wheezing or shortness of breath. 01/25/15  Yes Tommi Rumpshonda L Summers, PA-C  colchicine 0.6 MG tablet Take 2 tablets (1.2 mg total) by mouth once. 03/01/15  Yes Charmayne Sheerharles M Beers, PA-C  docusate sodium (COLACE) 100 MG capsule Take 1 capsule (100 mg total) by mouth 2 (two) times daily. 04/07/15 04/06/16 Yes Jene Everyobert Kinner, MD  HYDROcodone-acetaminophen (NORCO) 5-325 MG tablet Take 1 tablet by mouth every 6 (six) hours as needed for moderate pain. MAXIMUM TOTAL ACETAMINOPHEN DOSE IS 4000 MG PER DAY 01/24/16   Evon Slackhomas C Gaines, PA-C  ondansetron (ZOFRAN ODT) 4 MG disintegrating tablet Take 1 tablet (4 mg total) by mouth every 6 (six) hours as needed for nausea or vomiting.  03/12/16   Sharyn CreamerMark Sabrin Dunlevy, MD  ondansetron (ZOFRAN) 4 MG tablet Take 1 tablet (4 mg total) by mouth daily as needed for nausea or vomiting. 03/27/15   Phineas SemenGraydon Goodman, MD  ondansetron (ZOFRAN) 4 MG tablet Take 1 tablet (4 mg total) by mouth every 8 (eight) hours as needed for nausea or vomiting. 01/01/16   Phineas SemenGraydon Goodman, MD  oxyCODONE (ROXICODONE) 5 MG immediate release tablet Take 1 tablet (5 mg total) by mouth every 6 (six) hours as needed. 09/05/15 09/04/16  Evon Slackhomas C Gaines, PA-C  predniSONE (DELTASONE) 20 MG tablet Take 2 tablets (40 mg total) by mouth daily. 01/24/16   Evon Slackhomas C Gaines, PA-C    Allergies Shellfish allergy  No family history on file.  Social History Social History  Substance Use Topics  . Smoking status: Current Every Day Smoker    Packs/day: 0.20    Types: Cigarettes  . Smokeless tobacco: Never Used  . Alcohol use No    Review of Systems Constitutional: No fever/chills. Reports his 29-year-old son was sick with the same symptoms the last 2 days Eyes: No visual changes. ENT: No sore throat. Cardiovascular: Denies chest pain. Respiratory: Denies shortness of breath. Gastrointestinal:   No constipation. Genitourinary: Negative for dysuria. Musculoskeletal: Negative for back pain. Skin: Negative for rash. Neurological: Negative for headaches, focal weakness or numbness.  10-point ROS otherwise negative.  ____________________________________________   PHYSICAL EXAM:  VITAL SIGNS: ED Triage Vitals  Enc Vitals Group     BP 03/12/16  0757 126/77     Pulse Rate 03/12/16 0757 86     Resp 03/12/16 0757 16     Temp 03/12/16 0757 97.7 F (36.5 C)     Temp Source 03/12/16 0757 Oral     SpO2 03/12/16 0757 98 %     Weight 03/12/16 0758 285 lb (129.3 kg)     Height 03/12/16 0758 6' (1.829 m)     Head Circumference --      Peak Flow --      Pain Score --      Pain Loc --      Pain Edu? --      Excl. in GC? --     Constitutional: Alert and oriented. Well  appearing and in no acute distress. Eyes: Conjunctivae are normal. PERRL. EOMI. Head: Atraumatic. Nose: No congestion/rhinnorhea. Mouth/Throat: Mucous membranes are moist.  Oropharynx non-erythematous. Neck: No stridor.   Cardiovascular: Normal rate, regular rhythm. Grossly normal heart sounds.  Good peripheral circulation. Respiratory: Normal respiratory effort.  No retractions. Lungs CTAB. Gastrointestinal: Soft and nontender. No distention. No abdominal bruits. No rebound or guarding. No evidence peritonitis. No pain at McBurney's point. Negative Murphy. The present time the patient reports no pain Musculoskeletal: No lower extremity tenderness nor edema.  No joint effusions. Neurologic:  Normal speech and language. No gross focal neurologic deficits are appreciated. No gait instability. Skin:  Skin is warm, dry and intact. No rash noted. Psychiatric: Mood and affect are normal. Speech and behavior are normal.  ____________________________________________   LABS (all labs ordered are listed, but only abnormal results are displayed)  Labs Reviewed  COMPREHENSIVE METABOLIC PANEL - Abnormal; Notable for the following:       Result Value   Glucose, Bld 118 (*)    Calcium 8.6 (*)    All other components within normal limits  LIPASE, BLOOD  CBC  URINALYSIS COMPLETEWITH MICROSCOPIC ([facility] ONLY)   ____________________________________________  EKG   ____________________________________________  RADIOLOGY  I discussed with the patient the risks and benefits of abdominal CT scan. The present time there is no clear indication that the patient requires CT, the patient does have an abdominal complaint but exam does not suggest acute surgical abdomen and my suspicion for intra-abdominal infection including appendicitis, cholecystitis, aaa, dissection, ischemia, perforation, pancreatitis, diverticulitis or other acute major intra-abdominal process is quite low. After discussing the risks and  benefits including benefits of additional evaluation for diagnoses, ruling out infection/perforation/aaa/etc, but also discussing the risks including low, "well less than 1%," but not 0 risk of inducing cancers due to radiation and potential risks of contrast the patient indicated via our shared medical decision-making that she would not do a CAT scan. Rather if the patient does have worsening symptoms, develops a high fever, develops pain or persistent discomfort in the right upper quadrant or right lower quadrant, or other new concerns arise they will come back to emergency room right away. As the patient's clinician I think this is a very reasonable decision having discussed general risks and benefits of CT, and my clinical suspicion that CT would be of benefit at this time is very low.  ____________________________________________   PROCEDURES  Procedure(s) performed: None  Procedures  Critical Care performed: No  ____________________________________________   INITIAL IMPRESSION / ASSESSMENT AND PLAN / ED COURSE  Pertinent labs & imaging results that were available during my care of the patient were reviewed by me and considered in my medical decision making (see chart for details).  Differential  diagnosis includes but is not limited to, abdominal perforation, aortic dissection, cholecystitis, appendicitis, diverticulitis, colitis, esophagitis/gastritis, kidney stone, pyelonephritis, urinary tract infection, aortic aneurysm. All are considered in decision and treatment plan. Based upon the patient's presentation and risk factors, and the fact that his symptoms of largely improved with no evidence of abdominal pain and careful exam at this time I find it unlikely he has an acute abdomen or acute condition such as appendicitis. I discussed with the patient CT, with shared medical decision making he would like to monitor his symptoms as they are improving and come back if symptoms worsen and I  think this is reasonable. I discussed careful return precautions.  ----------------------------------------- 11:45 AM on 03/12/2016 -----------------------------------------  Patient reports he feels well. He's been able to take water without any vomiting, he's had no further diarrhea and reports no pain or discomfort or ongoing nausea at this time.  Return precautions and treatment recommendations and follow-up discussed with the patient who is agreeable with the plan.    Clinical Course     ____________________________________________   FINAL CLINICAL IMPRESSION(S) / ED DIAGNOSES  Final diagnoses:  Non-intractable vomiting with nausea, unspecified vomiting type      NEW MEDICATIONS STARTED DURING THIS VISIT:  New Prescriptions   ONDANSETRON (ZOFRAN ODT) 4 MG DISINTEGRATING TABLET    Take 1 tablet (4 mg total) by mouth every 6 (six) hours as needed for nausea or vomiting.     Note:  This document was prepared using Dragon voice recognition software and may include unintentional dictation errors.     Sharyn CreamerMark Ammi Hutt, MD 03/12/16 1145

## 2016-03-12 NOTE — ED Notes (Signed)
Pt resting in bed, eyes closed, resp even and unlabored

## 2016-03-12 NOTE — ED Notes (Signed)
Pt resting in bed, eyes closed, pt listening to music, pt in no acute distress

## 2016-03-12 NOTE — Discharge Instructions (Signed)
You were seen in the emergency room for abdominal pain. It is important that you follow up closely with your primary care doctor, urgent care, or the ER in the next couple of days for a recheck.  If you're unable to see your primary care doctor you may return to the emergency room or go to the [facility] in 1 or 2 days for reexam.  Please return to the emergency room right away if you are to develop a fever, severe nausea, your pain becomes severe or worsens, you are unable to keep food down, begin vomiting any dark or bloody fluid, you develop any dark or bloody stools, feel dehydrated, or other new concerns or symptoms arise.

## 2016-05-08 ENCOUNTER — Emergency Department
Admission: EM | Admit: 2016-05-08 | Discharge: 2016-05-08 | Disposition: A | Discharge status: Home / Self Care | Payer: Self-pay | Attending: Emergency Medicine | Admitting: Emergency Medicine

## 2016-05-08 ENCOUNTER — Encounter: Payer: Self-pay | Admitting: Emergency Medicine

## 2016-05-08 DIAGNOSIS — Z79899 Other long term (current) drug therapy: Secondary | ICD-10-CM | POA: Insufficient documentation

## 2016-05-08 DIAGNOSIS — K219 Gastro-esophageal reflux disease without esophagitis: Secondary | ICD-10-CM | POA: Insufficient documentation

## 2016-05-08 DIAGNOSIS — M10071 Idiopathic gout, right ankle and foot: Secondary | ICD-10-CM | POA: Insufficient documentation

## 2016-05-08 DIAGNOSIS — F17210 Nicotine dependence, cigarettes, uncomplicated: Secondary | ICD-10-CM | POA: Insufficient documentation

## 2016-05-08 DIAGNOSIS — M10072 Idiopathic gout, left ankle and foot: Secondary | ICD-10-CM

## 2016-05-08 DIAGNOSIS — J45909 Unspecified asthma, uncomplicated: Secondary | ICD-10-CM | POA: Insufficient documentation

## 2016-05-08 MED ORDER — COLCHICINE 0.6 MG PO TABS
1.2000 mg | ORAL_TABLET | Freq: Once | ORAL | Status: AC
  Administered 2016-05-08: 1.2 mg via ORAL

## 2016-05-08 MED ORDER — DICLOFENAC SODIUM 75 MG PO TBEC: 75.0000 mg | DELAYED_RELEASE_TABLET | Freq: Two times a day (BID) | ORAL | 1 refills | Status: DC

## 2016-05-08 MED ORDER — COLCHICINE 0.6 MG PO TABS: ORAL_TABLET | ORAL | 0 refills | Status: DC

## 2016-05-08 MED ORDER — COLCHICINE 0.6 MG PO TABS
ORAL_TABLET | ORAL | Status: AC
  Filled 2016-05-08: qty 2

## 2016-05-08 MED ORDER — RANITIDINE HCL 150 MG PO TABS: 150.0000 mg | ORAL_TABLET | Freq: Two times a day (BID) | ORAL | 1 refills | Status: DC

## 2016-05-08 MED ORDER — HYDROCODONE-ACETAMINOPHEN 5-325 MG PO TABS: 1.0000 | ORAL_TABLET | Freq: Four times a day (QID) | ORAL | 0 refills | Status: DC | PRN

## 2016-05-08 MED ORDER — HYDROCODONE-ACETAMINOPHEN 5-325 MG PO TABS
1.0000 | ORAL_TABLET | Freq: Once | ORAL | Status: AC
  Administered 2016-05-08: 1 via ORAL
  Filled 2016-05-08: qty 1

## 2016-05-08 NOTE — ED Provider Notes (Signed)
[facility] Emergency Department Provider Note ____________________________________________  Time seen: 1347  I have reviewed the triage vital signs and the nursing notes.  HISTORY  Chief Complaint  Foot Pain  HPI Jesus Singleton is a 30 y.o. male with a history of gouty arthritis, reports to the ED with left great toe pain for the last day. He describes the pain wasseverely worse upon awakening today. He admits to increased intake of red meats over the Christmas holiday. He reports previous medications prescribed for gout flares evolving completed. He denies any accident, injury, trauma to the foot. He presents with a single crutch for ambulation.  Past Medical History:  Diagnosis Date  . Asthma   . Gout     There are no active problems to display for this patient.   Past Surgical History:  Procedure Laterality Date  . bilateral leg surgery      Prior to Admission medications   Medication Sig Start Date End Date Taking? Authorizing Provider  albuterol (PROVENTIL HFA;VENTOLIN HFA) 108 (90 BASE) MCG/ACT inhaler Inhale 2 puffs into the lungs every 6 (six) hours as needed for wheezing or shortness of breath. 01/25/15   Tommi Rumpshonda L Summers, PA-C  colchicine 0.6 MG tablet Take 2 tabs PO x 1, then 1 tab PO 1 hour later x 1  Max: 1.8 mg total dose per attack, do not repeat within 3 days. 05/08/16   Alden Feagan V Bacon Daylin Gruszka, PA-C  diclofenac (VOLTAREN) 75 MG EC tablet Take 1 tablet (75 mg total) by mouth 2 (two) times daily. 05/08/16   Bridgid Printz V Bacon Karess Harner, PA-C  HYDROcodone-acetaminophen (NORCO) 5-325 MG tablet Take 1 tablet by mouth every 6 (six) hours as needed. 05/08/16   Rakia Frayne V Bacon Melania Kirks, PA-C  ondansetron (ZOFRAN ODT) 4 MG disintegrating tablet Take 1 tablet (4 mg total) by mouth every 6 (six) hours as needed for nausea or vomiting. 03/12/16   Sharyn CreamerMark Quale, MD  ranitidine (ZANTAC) 150 MG tablet Take 1 tablet (150 mg total) by mouth 2 (two) times daily.  05/08/16   Nickolai Rinks V Bacon Trinna Kunst, PA-C    Allergies Shellfish allergy  History reviewed. No pertinent family history.  Social History Social History  Substance Use Topics  . Smoking status: Current Every Day Smoker    Packs/day: 0.20    Types: Cigarettes  . Smokeless tobacco: Never Used  . Alcohol use No    Review of Systems  Constitutional: Negative for fever. Cardiovascular: Negative for chest pain. Respiratory: Negative for shortness of breath. Gastrointestinal: Negative for abdominal pain, vomiting and diarrhea. Reports acid reflux symptoms. Musculoskeletal: Negative for back pain. Left great toe pain as above. Skin: Negative for rash. Neurological: Negative for headaches, focal weakness or numbness. ____________________________________________  PHYSICAL EXAM:  VITAL SIGNS: ED Triage Vitals  Enc Vitals Group     BP 05/08/16 1327 130/85     Pulse Rate 05/08/16 1327 94     Resp 05/08/16 1327 18     Temp 05/08/16 1327 98 F (36.7 C)     Temp Source 05/08/16 1327 Oral     SpO2 05/08/16 1327 98 %     Weight 05/08/16 1305 285 lb (129.3 kg)     Height --      Head Circumference --      Peak Flow --      Pain Score 05/08/16 1305 10     Pain Loc --      Pain Edu? --      Excl.  in GC? --    Constitutional: Alert and oriented. Well appearing and in no distress. Head: Normocephalic and atraumatic. Cardiovascular: Normal rate, regular rhythm. Normal distal pulses. Respiratory: Normal respiratory effort. No wheezes/rales/rhonchi. Musculoskeletal: Left great toe with obvious erythema, and subtle swelling. Nontender with normal range of motion in all extremities.  Neurologic:  Normal gait without ataxia. Normal speech and language. No gross focal neurologic deficits are appreciated. Skin:  Skin is warm, dry and intact. No rash noted. ____________________________________________  PROCEDURES  Colchicine 1.2 mg PO Norco 5-325 mg  PO ____________________________________________  INITIAL IMPRESSION / ASSESSMENT AND PLAN / ED COURSE  Patient with an acute gout flare of the left great toe. He also has some reflux symptoms. He is discharged with a prescription for colchicine for acute gout flares, and Voltaren to dose for daily anti-inflammatory benefit. He is also provided with #10 hydrocodone tablets and a course of ranitidine dose for his reflux symptoms. He should follow-up with the local community clinics for ongoing symptom management.  Clinical Course    ____________________________________________  FINAL CLINICAL IMPRESSION(S) / ED DIAGNOSES  Final diagnoses:  Acute idiopathic gout involving toe of left foot  Gastroesophageal reflux disease without esophagitis      Lissa HoardJenise V Bacon Sukanya Goldblatt, PA-C 05/08/16 1932    Minna AntisKevin Paduchowski, MD 05/08/16 2022

## 2016-05-08 NOTE — Discharge Instructions (Signed)
Take the prescription meds as directed. [facility].

## 2016-05-08 NOTE — ED Notes (Signed)
See triage note   Developed pain to left foot/great toe couple of days ago  Thought he may have twisted foot while walking his dog  But this am pain increased with some swelling  Hx of gout

## 2016-05-08 NOTE — ED Triage Notes (Signed)
Pt to ed with c/o left foot pain that started this am.  Hx of gout.  Increased swelling and redness to foot.

## 2017-02-24 ENCOUNTER — Emergency Department: Payer: Self-pay

## 2017-02-24 ENCOUNTER — Telehealth: Payer: Self-pay | Admitting: Emergency Medicine

## 2017-02-24 ENCOUNTER — Emergency Department
Admission: EM | Admit: 2017-02-24 | Discharge: 2017-02-24 | Disposition: A | Discharge status: Home / Self Care | Payer: Self-pay | Attending: Emergency Medicine | Admitting: Emergency Medicine

## 2017-02-24 ENCOUNTER — Encounter: Payer: Self-pay | Admitting: Emergency Medicine

## 2017-02-24 DIAGNOSIS — Z79899 Other long term (current) drug therapy: Secondary | ICD-10-CM | POA: Insufficient documentation

## 2017-02-24 DIAGNOSIS — M79641 Pain in right hand: Secondary | ICD-10-CM | POA: Insufficient documentation

## 2017-02-24 DIAGNOSIS — J45909 Unspecified asthma, uncomplicated: Secondary | ICD-10-CM | POA: Insufficient documentation

## 2017-02-24 DIAGNOSIS — Z87891 Personal history of nicotine dependence: Secondary | ICD-10-CM | POA: Insufficient documentation

## 2017-02-24 DIAGNOSIS — R350 Frequency of micturition: Secondary | ICD-10-CM | POA: Insufficient documentation

## 2017-02-24 LAB — URINALYSIS, COMPLETE (UACMP) WITH MICROSCOPIC
Bilirubin Urine: NEGATIVE
Glucose, UA: NEGATIVE mg/dL
Ketones, ur: NEGATIVE mg/dL
Nitrite: NEGATIVE
Protein, ur: NEGATIVE mg/dL
Specific Gravity, Urine: 1.013 (ref 1.005–1.030)
Squamous Epithelial / LPF: NONE SEEN
pH: 6 (ref 5.0–8.0)

## 2017-02-24 LAB — CHLAMYDIA/NGC RT PCR (ARMC ONLY)
Chlamydia Tr: NOT DETECTED
N gonorrhoeae: NOT DETECTED

## 2017-02-24 LAB — RAPID HIV SCREEN (HIV 1/2 AB+AG)
HIV 1/2 Antibodies: NONREACTIVE
HIV-1 P24 Antigen - HIV24: NONREACTIVE

## 2017-02-24 LAB — GLUCOSE, CAPILLARY: Glucose-Capillary: 115 mg/dL — ABNORMAL HIGH (ref 65–99)

## 2017-02-24 MED ORDER — AZITHROMYCIN 500 MG PO TABS
1000.0000 mg | ORAL_TABLET | Freq: Once | ORAL | Status: AC
  Administered 2017-02-24: 1000 mg via ORAL
  Filled 2017-02-24: qty 2

## 2017-02-24 MED ORDER — LIDOCAINE HCL (PF) 1 % IJ SOLN
INTRAMUSCULAR | Status: DC
Start: 2017-02-24 — End: 2017-02-24
  Filled 2017-02-24: qty 5

## 2017-02-24 MED ORDER — CEFTRIAXONE SODIUM 250 MG IJ SOLR
250.0000 mg | Freq: Once | INTRAMUSCULAR | Status: AC
  Administered 2017-02-24: 250 mg via INTRAMUSCULAR
  Filled 2017-02-24: qty 250

## 2017-02-24 MED ORDER — AZITHROMYCIN 1 G PO PACK: 1.0000 g | PACK | Freq: Once | ORAL | Status: DC

## 2017-02-24 NOTE — ED Notes (Signed)
Xray at bedside for films  

## 2017-02-24 NOTE — Telephone Encounter (Signed)
Pt called askign for results for std testing.  Gave him results.

## 2017-02-24 NOTE — ED Notes (Signed)
Blood glucose 115.

## 2017-02-24 NOTE — ED Notes (Signed)
Dr Derrill Kay in to see pt

## 2017-02-24 NOTE — Discharge Instructions (Signed)
Please seek medical attention for any high fevers, chest pain, shortness of breath, change in behavior, persistent vomiting, bloody stool or any other new or concerning symptoms.  

## 2017-02-24 NOTE — ED Provider Notes (Signed)
[facility] Emergency Department Provider Note   ____________________________________________   I have reviewed the triage vital signs and the nursing notes.   HISTORY  Chief Complaint Hand Pain and Urinary Frequency   History limited by: Not Limited   HPI Jesus Singleton is a 31 y.o. male who presents to the emergency department today because of concern for hand pain and urinary frequency.   LOCATION:right hand DURATION:yesterday TIMING: started suddenly  SEVERITY: severe QUALITY: painful CONTEXT: patient fell onto his hand when he was letting his dog out. In addition he states he has had urinary frequency for 4-5 days. Is sexually active. MODIFYING FACTORS: hand pain worse with movement ASSOCIATED SYMPTOMS: swelling to hand. No fevers. No painful urination.  Per medical record review patient has a history of gout.  Past Medical History:  Diagnosis Date  . Asthma   . Gout     There are no active problems to display for this patient.   Past Surgical History:  Procedure Laterality Date  . bilateral leg surgery      Prior to Admission medications   Medication Sig Start Date End Date Taking? Authorizing Provider  albuterol (PROVENTIL HFA;VENTOLIN HFA) 108 (90 BASE) MCG/ACT inhaler Inhale 2 puffs into the lungs every 6 (six) hours as needed for wheezing or shortness of breath. 01/25/15   Tommi Rumps, PA-C  colchicine 0.6 MG tablet Take 2 tabs PO x 1, then 1 tab PO 1 hour later x 1  Max: 1.8 mg total dose per attack, do not repeat within 3 days. 05/08/16   Menshew, Charlesetta Ivory, PA-C  diclofenac (VOLTAREN) 75 MG EC tablet Take 1 tablet (75 mg total) by mouth 2 (two) times daily. 05/08/16   Menshew, Charlesetta Ivory, PA-C  HYDROcodone-acetaminophen (NORCO) 5-325 MG tablet Take 1 tablet by mouth every 6 (six) hours as needed. 05/08/16   Menshew, Charlesetta Ivory, PA-C  ondansetron (ZOFRAN ODT) 4 MG disintegrating tablet Take 1 tablet (4 mg  total) by mouth every 6 (six) hours as needed for nausea or vomiting. 03/12/16   Sharyn Creamer, MD  ranitidine (ZANTAC) 150 MG tablet Take 1 tablet (150 mg total) by mouth 2 (two) times daily. 05/08/16   Menshew, Charlesetta Ivory, PA-C    Allergies Shellfish allergy  No family history on file.  Social History Social History  Substance Use Topics  . Smoking status: Former Smoker    Packs/day: 0.20    Types: Cigarettes  . Smokeless tobacco: Never Used  . Alcohol use No    Review of Systems Constitutional: No fever/chills Eyes: No visual changes. ENT: No sore throat. Cardiovascular: Denies chest pain. Respiratory: Denies shortness of breath. Gastrointestinal: No abdominal pain.  No nausea, no vomiting.  No diarrhea.   Genitourinary: positive for urinary frequency. Musculoskeletal: positive for right hand pain. Skin: Negative for rash. Neurological: Negative for headaches, focal weakness or numbness.  ____________________________________________   PHYSICAL EXAM:  VITAL SIGNS: ED Triage Vitals  Enc Vitals Group     BP 02/24/17 0416 140/79     Pulse Rate 02/24/17 0416 84     Resp 02/24/17 0416 18     Temp 02/24/17 0416 97.8 F (36.6 C)     Temp Source 02/24/17 0416 Oral     SpO2 02/24/17 0416 98 %     Weight 02/24/17 0416 300 lb (136.1 kg)     Height 02/24/17 0416 6' (1.829 m)     Head Circumference --  Peak Flow --      Pain Score 02/24/17 0415 10   Constitutional: Alert and oriented. Well appearing and in no distress. Eyes: Conjunctivae are normal.  ENT   Head: Normocephalic and atraumatic.   Nose: No congestion/rhinnorhea.   Mouth/Throat: Mucous membranes are moist.   Neck: No stridor. Hematological/Lymphatic/Immunilogical: No cervical lymphadenopathy. Cardiovascular: Normal rate, regular rhythm.  No murmurs, rubs, or gallops. Respiratory: Normal respiratory effort without tachypnea nor retractions. Breath sounds are clear and equal bilaterally.  No wheezes/rales/rhonchi. Gastrointestinal: Soft and non tender. No rebound. No guarding.  Genitourinary: Deferred Musculoskeletal: Normal range of motion in all extremities. Right hand with some swelling. No obvious deformity. Neurologic:  Normal speech and language. No gross focal neurologic deficits are appreciated.  Skin:  Skin is warm, dry and intact. No rash noted. Psychiatric: Mood and affect are normal. Speech and behavior are normal. Patient exhibits appropriate insight and judgment.  ____________________________________________    LABS (pertinent positives/negatives)  Glucose 115 UA wbc 6-30, moderate leukocytes  ____________________________________________   EKG  None  ____________________________________________    RADIOLOGY  Right hand No acute fracture  ____________________________________________   PROCEDURES  Procedures  ____________________________________________   INITIAL IMPRESSION / ASSESSMENT AND PLAN / ED COURSE  Pertinent labs & imaging results that were available during my care of the patient were reviewed by me and considered in my medical decision making (see chart for details).  Patient presented with urinary frequency. He is sexually active. Ddx includes STD vs non STD UTI. UA was consistent with infection. However given sexually activity will empirically treat for STD. HIV was tested per CDC recommendations. Additionally hand xray was performed to evaluate for fracture. This was negative.   Discussed with patient results of testing.  ____________________________________________   FINAL CLINICAL IMPRESSION(S) / ED DIAGNOSES  Final diagnoses:  Urinary frequency  Right hand pain     Note: This dictation was prepared with Dragon dictation. Any transcriptional errors that result from this process are unintentional     Phineas Semen, MD 02/24/17 [phone]

## 2017-02-24 NOTE — ED Triage Notes (Signed)
Patient states that he fell 2 nights ago and has pain and swelling to right hand. Patient also states that he has had urinary frequency times one week. Patient denies pain with urination.

## 2017-02-24 NOTE — ED Notes (Signed)
Pt presents with 2 complaints; c/o right hand pain after a fall on Saturday; says he was walking his dog when he tripped, landing on his right hand wrong; mild swelling noted near knuckles between 2nd, 3rd and 4th digits; pt also c/o urinary frequency for a week; denies dysuria; denies back pain; denies fever; denies penile discharge; pt says he had unprotected intercourse with his girlfriend last night;

## 2017-02-25 LAB — URINE CULTURE: Culture: NO GROWTH

## 2017-03-14 ENCOUNTER — Emergency Department: Payer: Self-pay

## 2017-03-14 ENCOUNTER — Emergency Department
Admission: EM | Admit: 2017-03-14 | Discharge: 2017-03-14 | Disposition: A | Discharge status: Home / Self Care | Payer: Self-pay | Attending: Emergency Medicine | Admitting: Emergency Medicine

## 2017-03-14 DIAGNOSIS — Y929 Unspecified place or not applicable: Secondary | ICD-10-CM | POA: Insufficient documentation

## 2017-03-14 DIAGNOSIS — Z79899 Other long term (current) drug therapy: Secondary | ICD-10-CM | POA: Insufficient documentation

## 2017-03-14 DIAGNOSIS — J45909 Unspecified asthma, uncomplicated: Secondary | ICD-10-CM | POA: Insufficient documentation

## 2017-03-14 DIAGNOSIS — Y939 Activity, unspecified: Secondary | ICD-10-CM | POA: Insufficient documentation

## 2017-03-14 DIAGNOSIS — S9031XA Contusion of right foot, initial encounter: Secondary | ICD-10-CM | POA: Insufficient documentation

## 2017-03-14 DIAGNOSIS — W2209XA Striking against other stationary object, initial encounter: Secondary | ICD-10-CM | POA: Insufficient documentation

## 2017-03-14 DIAGNOSIS — Z87891 Personal history of nicotine dependence: Secondary | ICD-10-CM | POA: Insufficient documentation

## 2017-03-14 DIAGNOSIS — Y999 Unspecified external cause status: Secondary | ICD-10-CM | POA: Insufficient documentation

## 2017-03-14 MED ORDER — IBUPROFEN 800 MG PO TABS: 800.0000 mg | ORAL_TABLET | Freq: Three times a day (TID) | ORAL | 0 refills | Status: DC | PRN

## 2017-03-14 MED ORDER — TRAMADOL HCL 50 MG PO TABS: 50.0000 mg | ORAL_TABLET | Freq: Four times a day (QID) | ORAL | 0 refills | Status: DC | PRN

## 2017-03-14 NOTE — ED Triage Notes (Signed)
Pt presents to ED via POV with c/o RIGHT foot pain. Pt denies recent injury or trauma, but states "I hit it on the bed rail this morning. I was dreaming that I was late for work and when I jumped up real fast, I hit it really hard". Pt reports h/x of Gout, but reports, "It's usually the other side". No obvious dislocation or deformity; CMS intact.

## 2017-03-14 NOTE — ED Provider Notes (Signed)
[facility] Emergency Department Provider Note   ____________________________________________   First MD Initiated Contact with Patient 03/14/17 [phone]     (approximate)  I have reviewed the triage vital signs and the nursing notes.   HISTORY  Chief Complaint Foot Pain    HPI Jesus Singleton is a 31 y.o. male patient complaining or right dorsal foot pain secondary to hitting his foot on the beta railing this morning. Patient states pain increase weightbearing. Patient rates the pain as a 10 over 10. No palliative measures for complaint. Patient describes pain as "sharp". Patient state has a history of gout but this pain is different.  Past Medical History:  Diagnosis Date  . Asthma   . Gout     There are no active problems to display for this patient.   Past Surgical History:  Procedure Laterality Date  . bilateral leg surgery      Prior to Admission medications   Medication Sig Start Date End Date Taking? Authorizing Provider  albuterol (PROVENTIL HFA;VENTOLIN HFA) 108 (90 BASE) MCG/ACT inhaler Inhale 2 puffs into the lungs every 6 (six) hours as needed for wheezing or shortness of breath. 01/25/15   Tommi RumpsSummers, Rhonda L, PA-C  colchicine 0.6 MG tablet Take 2 tabs PO x 1, then 1 tab PO 1 hour later x 1  Max: 1.8 mg total dose per attack, do not repeat within 3 days. 05/08/16   Menshew, Charlesetta IvoryJenise V Bacon, PA-C  diclofenac (VOLTAREN) 75 MG EC tablet Take 1 tablet (75 mg total) by mouth 2 (two) times daily. 05/08/16   Menshew, Charlesetta IvoryJenise V Bacon, PA-C  HYDROcodone-acetaminophen (NORCO) 5-325 MG tablet Take 1 tablet by mouth every 6 (six) hours as needed. 05/08/16   Menshew, Charlesetta IvoryJenise V Bacon, PA-C  ibuprofen (ADVIL,MOTRIN) 800 MG tablet Take 1 tablet (800 mg total) by mouth every 8 (eight) hours as needed for moderate pain. 03/14/17   Joni ReiningSmith, Brytni Dray K, PA-C  ondansetron (ZOFRAN ODT) 4 MG disintegrating tablet Take 1 tablet (4 mg total) by mouth every 6 (six) hours as  needed for nausea or vomiting. 03/12/16   Sharyn CreamerQuale, Mark, MD  ranitidine (ZANTAC) 150 MG tablet Take 1 tablet (150 mg total) by mouth 2 (two) times daily. 05/08/16   Menshew, Charlesetta IvoryJenise V Bacon, PA-C  traMADol (ULTRAM) 50 MG tablet Take 1 tablet (50 mg total) by mouth every 6 (six) hours as needed for moderate pain. 03/14/17   Joni ReiningSmith, Zeena Starkel K, PA-C    Allergies Shellfish allergy  No family history on file.  Social History Social History  Substance Use Topics  . Smoking status: Former Smoker    Packs/day: 0.20    Types: Cigarettes  . Smokeless tobacco: Never Used  . Alcohol use No    Review of Systems Constitutional: No fever/chills Eyes: No visual changes. ENT: No sore throat. Cardiovascular: Denies chest pain. Respiratory: Denies shortness of breath. Gastrointestinal: No abdominal pain.  No nausea, no vomiting.  No diarrhea.  No constipation. Genitourinary: Negative for dysuria. Musculoskeletal: Right foot pain  Skin: Negative for rash. Neurological: Negative for headaches, focal weakness or numbness. Endocrine:Gout Allergic/Immunilogical: Shellfish  ____________________________________________   PHYSICAL EXAM:  VITAL SIGNS: ED Triage Vitals  Enc Vitals Group     BP 03/14/17 0654 (!) 144/86     Pulse Rate 03/14/17 0654 99     Resp 03/14/17 0654 18     Temp 03/14/17 0654 98 F (36.7 C)     Temp Source 03/14/17 0654 Oral  SpO2 03/14/17 0654 97 %     Weight 03/14/17 0653 280 lb (127 kg)     Height 03/14/17 0653 6' (1.829 m)     Head Circumference --      Peak Flow --      Pain Score 03/14/17 0653 10     Pain Loc --      Pain Edu? --      Excl. in GC? --    Constitutional: Alert and oriented. Well appearing and in no acute distress. Cardiovascular: Normal rate, regular rhythm. Grossly normal heart sounds.  Good peripheral circulation. Respiratory: Normal respiratory effort.  No retractions. Lungs CTAB. Gastrointestinal: Soft and nontender. No distention. No  abdominal bruits. No CVA tenderness. Musculoskeletal: No deformity to the right foot. Moderate guarding palpation. Mild edema dose aspect of foot. Neurologic:  Normal speech and language. No gross focal neurologic deficits are appreciated. No gait instability. Skin:  Skin is warm, dry and intact. No rash noted. Psychiatric: Mood and affect are normal. Speech and behavior are normal.  ____________________________________________   LABS (all labs ordered are listed, but only abnormal results are displayed)  Labs Reviewed - No data to display ____________________________________________  EKG   ____________________________________________  RADIOLOGY  Dg Foot Complete Right  Result Date: 03/14/2017 CLINICAL DATA:  Pain after hitting foot on bed post EXAM: RIGHT FOOT COMPLETE - 3+ VIEW COMPARISON:  Right first toe November 19, 2014 FINDINGS: Frontal, oblique, and lateral views were obtained. There is mild soft tissue swelling. No fracture or dislocation. Joint spaces appear normal. No erosive change. IMPRESSION: Mild soft tissue swelling. No fracture or dislocation. No evident arthropathy. Electronically Signed   By: Bretta Bang III M.D.   On: 03/14/2017 08:10    Soft tissue edema but no other acute findings on x-ray of the right foot.   PROCEDURES  Procedure(s) performed: None  Procedures  Critical Care performed: No  ____________________________________________   INITIAL IMPRESSION / ASSESSMENT AND PLAN / ED COURSE  As part of my medical decision making, I reviewed the following data within the electronic MEDICAL RECORD NUMBER    Right foot pain secondary to contusion. Discussed x-ray findings with patient. Patient given discharge care instructions and a work no. Patient last take medication as directed. Patient [facility].      ____________________________________________   FINAL CLINICAL IMPRESSION(S) / ED  DIAGNOSES  Final diagnoses:  Contusion of right foot, initial encounter      NEW MEDICATIONS STARTED DURING THIS VISIT:  New Prescriptions   IBUPROFEN (ADVIL,MOTRIN) 800 MG TABLET    Take 1 tablet (800 mg total) by mouth every 8 (eight) hours as needed for moderate pain.   TRAMADOL (ULTRAM) 50 MG TABLET    Take 1 tablet (50 mg total) by mouth every 6 (six) hours as needed for moderate pain.     Note:  This document was prepared using Dragon voice recognition software and may include unintentional dictation errors.    Joni Reining, PA-C 03/14/17 9604    Pershing Proud Myra Rude, MD 03/14/17 [phone]

## 2017-03-14 NOTE — ED Notes (Signed)
NAD noted at time of D/C. Pt denies questions or concerns. Pt ambulatory to the lobby at this time.  

## 2017-06-03 ENCOUNTER — Other Ambulatory Visit: Payer: Self-pay

## 2017-06-03 ENCOUNTER — Encounter: Payer: Self-pay | Admitting: Emergency Medicine

## 2017-06-03 ENCOUNTER — Emergency Department
Admission: EM | Admit: 2017-06-03 | Discharge: 2017-06-03 | Disposition: A | Discharge status: Home / Self Care | Payer: Self-pay | Attending: Emergency Medicine | Admitting: Emergency Medicine

## 2017-06-03 DIAGNOSIS — Z79899 Other long term (current) drug therapy: Secondary | ICD-10-CM | POA: Insufficient documentation

## 2017-06-03 DIAGNOSIS — J45909 Unspecified asthma, uncomplicated: Secondary | ICD-10-CM | POA: Insufficient documentation

## 2017-06-03 DIAGNOSIS — R51 Headache: Secondary | ICD-10-CM | POA: Insufficient documentation

## 2017-06-03 DIAGNOSIS — H60503 Unspecified acute noninfective otitis externa, bilateral: Secondary | ICD-10-CM | POA: Insufficient documentation

## 2017-06-03 DIAGNOSIS — Z87891 Personal history of nicotine dependence: Secondary | ICD-10-CM | POA: Insufficient documentation

## 2017-06-03 MED ORDER — NEOMYCIN-POLYMYXIN-HC 3.5-10000-1 OT SOLN: 3.0000 [drp] | Freq: Three times a day (TID) | OTIC | 0 refills | Status: AC

## 2017-06-03 NOTE — ED Provider Notes (Signed)
[facility] Emergency Department Provider Note   ____________________________________________    I have reviewed the triage vital signs and the nursing notes.   HISTORY  Chief Complaint Ear pain    HPI Jesus Singleton is a 32 y.o. male who presents with ear pain bilaterally.  Patient reports over the last couple of days he has had a burning sensation in his ears bilaterally.  He has noticed fluid like discharge from his ears as well.  No trauma to the area.  Denies fevers or chills.  Has had a mild headache.  No neck pain.  No recent swimming.  Past Medical History:  Diagnosis Date  . Asthma   . Gout     There are no active problems to display for this patient.   Past Surgical History:  Procedure Laterality Date  . bilateral leg surgery      Prior to Admission medications   Medication Sig Start Date End Date Taking? Authorizing Provider  albuterol (PROVENTIL HFA;VENTOLIN HFA) 108 (90 BASE) MCG/ACT inhaler Inhale 2 puffs into the lungs every 6 (six) hours as needed for wheezing or shortness of breath. 01/25/15   Tommi Rumps, PA-C  colchicine 0.6 MG tablet Take 2 tabs PO x 1, then 1 tab PO 1 hour later x 1  Max: 1.8 mg total dose per attack, do not repeat within 3 days. 05/08/16   Menshew, Charlesetta Ivory, PA-C  diclofenac (VOLTAREN) 75 MG EC tablet Take 1 tablet (75 mg total) by mouth 2 (two) times daily. 05/08/16   Menshew, Charlesetta Ivory, PA-C  HYDROcodone-acetaminophen (NORCO) 5-325 MG tablet Take 1 tablet by mouth every 6 (six) hours as needed. 05/08/16   Menshew, Charlesetta Ivory, PA-C  ibuprofen (ADVIL,MOTRIN) 800 MG tablet Take 1 tablet (800 mg total) by mouth every 8 (eight) hours as needed for moderate pain. 03/14/17   Joni Reining, PA-C  neomycin-polymyxin-hydrocortisone (CORTISPORIN) OTIC solution Place 3 drops into both ears 3 (three) times daily for 10 days. 06/03/17 06/13/17  Jene Every, MD  ondansetron (ZOFRAN ODT) 4 MG  disintegrating tablet Take 1 tablet (4 mg total) by mouth every 6 (six) hours as needed for nausea or vomiting. 03/12/16   Sharyn Creamer, MD  ranitidine (ZANTAC) 150 MG tablet Take 1 tablet (150 mg total) by mouth 2 (two) times daily. 05/08/16   Menshew, Charlesetta Ivory, PA-C  traMADol (ULTRAM) 50 MG tablet Take 1 tablet (50 mg total) by mouth every 6 (six) hours as needed for moderate pain. 03/14/17   Joni Reining, PA-C     Allergies Shellfish allergy  No family history on file.  Social History Social History   Tobacco Use  . Smoking status: Former Smoker    Packs/day: 0.20    Types: Cigarettes  . Smokeless tobacco: Never Used  Substance Use Topics  . Alcohol use: No  . Drug use: No    Review of Systems  Constitutional: No fever/chills  ENT: No sore throat.   Gastrointestinal: No nausea  Musculoskeletal: Negative for joint pain Skin: Negative for rash.     ____________________________________________   PHYSICAL EXAM:  VITAL SIGNS: ED Triage Vitals  Enc Vitals Group     BP 06/03/17 0642 (!) 156/87     Pulse Rate 06/03/17 0642 97     Resp 06/03/17 0642 18     Temp 06/03/17 0642 98.4 F (36.9 C)     Temp Source 06/03/17 0642 Oral  SpO2 06/03/17 0642 100 %     Weight 06/03/17 0639 122.5 kg (270 lb)     Height 06/03/17 0639 1.829 m (6')     Head Circumference --      Peak Flow --      Pain Score 06/03/17 0639 8     Pain Loc --      Pain Edu? --      Excl. in GC? --      Constitutional: Alert and oriented. No acute distress. Pleasant and interactive Eyes: Conjunctivae are normal.  Ears: Whitish exudate along the external acoustic meatus with some clear discharge consistent with otitis externa Head: Atraumatic.  Mouth/Throat: Mucous membranes are moist.   Cardiovascular: Normal rate, regular rhythm.  Respiratory: Normal respiratory effort.  No retractions.   Neurologic:  Normal speech and language. No gross focal neurologic deficits are  appreciated.   Skin:  Skin is warm, dry and intact. No rash noted.   ____________________________________________   LABS (all labs ordered are listed, but only abnormal results are displayed)  Labs Reviewed - No data to display ____________________________________________  EKG   ____________________________________________  RADIOLOGY  None ____________________________________________   PROCEDURES  Procedure(s) performed:No  Procedures   Critical Care performed: No ____________________________________________   INITIAL IMPRESSION / ASSESSMENT AND PLAN / ED COURSE  Pertinent labs & imaging results that were available during my care of the patient were reviewed by me and considered in my medical decision making (see chart for details).  Exam is consistent with otitis externa, will treat with Cortisporin.  Outpatient follow-up with PCP.  Return precautions discussed   ____________________________________________   FINAL CLINICAL IMPRESSION(S) / ED DIAGNOSES  Final diagnoses:  Acute otitis externa of both ears, unspecified type      NEW MEDICATIONS STARTED DURING THIS VISIT:  Discharge Medication List as of 06/03/2017  7:19 AM    START taking these medications   Details  neomycin-polymyxin-hydrocortisone (CORTISPORIN) OTIC solution Place 3 drops into both ears 3 (three) times daily for 10 days., Starting Tue 06/03/2017, Until Fri 06/13/2017, Print         Note:  This document was prepared using Dragon voice recognition software and may include unintentional dictation errors.    Jene EveryKinner, Shan Valdes, MD 06/03/17 [phone]

## 2017-06-03 NOTE — ED Notes (Signed)
Charge Nurse Stephaine aware of patient placement in room.

## 2017-06-03 NOTE — ED Triage Notes (Signed)
Patient ambulatory to triage with steady gait, without difficulty or distress noted; pt reports left temporal HA and bilat earache with recent sinus drainage

## 2017-07-08 ENCOUNTER — Emergency Department: Payer: Self-pay

## 2017-07-08 ENCOUNTER — Emergency Department
Admission: EM | Admit: 2017-07-08 | Discharge: 2017-07-08 | Disposition: A | Discharge status: Home / Self Care | Payer: Self-pay | Attending: Emergency Medicine | Admitting: Emergency Medicine

## 2017-07-08 ENCOUNTER — Encounter: Payer: Self-pay | Admitting: Emergency Medicine

## 2017-07-08 DIAGNOSIS — X509XXA Other and unspecified overexertion or strenuous movements or postures, initial encounter: Secondary | ICD-10-CM | POA: Insufficient documentation

## 2017-07-08 DIAGNOSIS — Z87891 Personal history of nicotine dependence: Secondary | ICD-10-CM | POA: Insufficient documentation

## 2017-07-08 DIAGNOSIS — Y998 Other external cause status: Secondary | ICD-10-CM | POA: Insufficient documentation

## 2017-07-08 DIAGNOSIS — Y939 Activity, unspecified: Secondary | ICD-10-CM | POA: Insufficient documentation

## 2017-07-08 DIAGNOSIS — Z79899 Other long term (current) drug therapy: Secondary | ICD-10-CM | POA: Insufficient documentation

## 2017-07-08 DIAGNOSIS — S93602A Unspecified sprain of left foot, initial encounter: Secondary | ICD-10-CM

## 2017-07-08 DIAGNOSIS — J45909 Unspecified asthma, uncomplicated: Secondary | ICD-10-CM | POA: Insufficient documentation

## 2017-07-08 DIAGNOSIS — Y929 Unspecified place or not applicable: Secondary | ICD-10-CM | POA: Insufficient documentation

## 2017-07-08 MED ORDER — IBUPROFEN 800 MG PO TABS: 800.0000 mg | ORAL_TABLET | Freq: Three times a day (TID) | ORAL | 0 refills | Status: DC | PRN

## 2017-07-08 NOTE — ED Triage Notes (Signed)
Pt with left foot pain since Sunday, twisted it.

## 2017-07-08 NOTE — ED Notes (Signed)
Ace wrap and crutches given  States he has a wood shoe at home

## 2017-07-08 NOTE — ED Notes (Signed)
See triage note  Presents with pain to left foot  States he twisted foot on Saturday  No deformity noted  Good pulses

## 2017-07-08 NOTE — ED Provider Notes (Signed)
[facility] Emergency Department Provider Note  ____________________________________________  Time seen: Approximately 11:41 AM  I have reviewed the triage vital signs and the nursing notes.   HISTORY  Chief Complaint Foot Pain    HPI Jesus Singleton is a 32 y.o. male that presents to the emergency department for evaluation of left foot pain for 2 days.  Patient states that he was helping his brother and twisted his foot.  Pain is primarily on the top and on the outside of his foot.  He was walking on it fine but was climbing up and down and walking a lot yesterday and pain and swelling has increased since then.  He has placed an ice pack on his foot, which helped.  No additional injuries.  No numbness, tingling.   Past Medical History:  Diagnosis Date  . Asthma   . Gout     There are no active problems to display for this patient.   Past Surgical History:  Procedure Laterality Date  . bilateral leg surgery      Prior to Admission medications   Medication Sig Start Date End Date Taking? Authorizing Provider  albuterol (PROVENTIL HFA;VENTOLIN HFA) 108 (90 BASE) MCG/ACT inhaler Inhale 2 puffs into the lungs every 6 (six) hours as needed for wheezing or shortness of breath. 01/25/15   Tommi Rumps, PA-C  colchicine 0.6 MG tablet Take 2 tabs PO x 1, then 1 tab PO 1 hour later x 1  Max: 1.8 mg total dose per attack, do not repeat within 3 days. 05/08/16   Menshew, Charlesetta Ivory, PA-C  diclofenac (VOLTAREN) 75 MG EC tablet Take 1 tablet (75 mg total) by mouth 2 (two) times daily. 05/08/16   Menshew, Charlesetta Ivory, PA-C  HYDROcodone-acetaminophen (NORCO) 5-325 MG tablet Take 1 tablet by mouth every 6 (six) hours as needed. 05/08/16   Menshew, Charlesetta Ivory, PA-C  ibuprofen (ADVIL,MOTRIN) 800 MG tablet Take 1 tablet (800 mg total) by mouth every 8 (eight) hours as needed. 07/08/17   Enid Derry, PA-C  ondansetron (ZOFRAN ODT) 4 MG disintegrating  tablet Take 1 tablet (4 mg total) by mouth every 6 (six) hours as needed for nausea or vomiting. 03/12/16   Sharyn Creamer, MD  ranitidine (ZANTAC) 150 MG tablet Take 1 tablet (150 mg total) by mouth 2 (two) times daily. 05/08/16   Menshew, Charlesetta Ivory, PA-C  traMADol (ULTRAM) 50 MG tablet Take 1 tablet (50 mg total) by mouth every 6 (six) hours as needed for moderate pain. 03/14/17   Joni Reining, PA-C    Allergies Shellfish allergy  No family history on file.  Social History Social History   Tobacco Use  . Smoking status: Former Smoker    Packs/day: 0.20    Types: Cigarettes  . Smokeless tobacco: Never Used  Substance Use Topics  . Alcohol use: No  . Drug use: No     Review of Systems  Constitutional: No fever/chills Cardiovascular: No chest pain. Respiratory: No SOB. Gastrointestinal: No abdominal pain.  No nausea, no vomiting.  Musculoskeletal: Positive for foot pain. Skin: Negative for rash, abrasions, lacerations, ecchymosis. Neurological: Negative for numbness or tingling   ____________________________________________   PHYSICAL EXAM:  VITAL SIGNS: ED Triage Vitals  Enc Vitals Group     BP 07/08/17 0947 (!) 144/90     Pulse Rate 07/08/17 0947 98     Resp 07/08/17 0947 18     Temp 07/08/17 0947 97.9 F (36.6 C)  Temp Source 07/08/17 0947 Oral     SpO2 07/08/17 0947 98 %     Weight 07/08/17 0944 270 lb (122.5 kg)     Height --      Head Circumference --      Peak Flow --      Pain Score 07/08/17 0944 5     Pain Loc --      Pain Edu? --      Excl. in GC? --      Constitutional: Alert and oriented. Well appearing and in no acute distress. Eyes: Conjunctivae are normal. PERRL. EOMI. Head: Atraumatic. ENT:      Ears:      Nose: No congestion/rhinnorhea.      Mouth/Throat: Mucous membranes are moist.  Neck: No stridor.  Cardiovascular: Normal rate, regular rhythm.  Good peripheral circulation.  Palpable dorsalis pedis pulses  bilaterally. Respiratory: Normal respiratory effort without tachypnea or retractions. Lungs CTAB. Good air entry to the bases with no decreased or absent breath sounds. Musculoskeletal: Full range of motion to all extremities. No gross deformities appreciated.  Mild swelling and tenderness to palpation to lateral left foot.  Full range of motion of ankle.  No bruising. Neurologic:  Normal speech and language. No gross focal neurologic deficits are appreciated.  Skin:  Skin is warm, dry and intact. No rash noted.   ____________________________________________   LABS (all labs ordered are listed, but only abnormal results are displayed)  Labs Reviewed - No data to display ____________________________________________  EKG   ____________________________________________  RADIOLOGY *I, Enid DerryAshley Olivya Sobol, personally viewed and evaluated these images (plain radiographs) as part of my medical decision making, as well as reviewing the written report by the radiologist.  Dg Foot Complete Left  Result Date: 07/08/2017 CLINICAL DATA:  Left foot pain after recent injury EXAM: LEFT FOOT - COMPLETE 3+ VIEW COMPARISON:  None. FINDINGS: No fracture or dislocation. No suspicious focal osseous lesion. No significant arthropathy. Tiny Achilles left calcaneal spur. No radiopaque foreign body. IMPRESSION: No left foot fracture or dislocation. Electronically Signed   By: Delbert PhenixJason A Poff M.D.   On: 07/08/2017 10:26    ____________________________________________    PROCEDURES  Procedure(s) performed:    Procedures    Medications - No data to display   ____________________________________________   INITIAL IMPRESSION / ASSESSMENT AND PLAN / ED COURSE  Pertinent labs & imaging results that were available during my care of the patient were reviewed by me and considered in my medical decision making (see chart for details).  Review of the [place] CSRS was performed in accordance of the [facility] prior to  dispensing any controlled drugs.     Patient's diagnosis is consistent with foot sprain.  Vital signs and exam are reassuring.  X-ray negative for acute bony abnormalities.  Foot was Ace wrapped.  Postop shoe and crutches were given.  Patient will be discharged home with prescriptions for ibuprofen. Patient is to follow up with podiatry as directed. Patient is given ED precautions to return to the ED for any worsening or new symptoms.     ____________________________________________  FINAL CLINICAL IMPRESSION(S) / ED DIAGNOSES  Final diagnoses:  Sprain of left foot, initial encounter      NEW MEDICATIONS STARTED DURING THIS VISIT:  ED Discharge Orders        Ordered    ibuprofen (ADVIL,MOTRIN) 800 MG tablet  Every 8 hours PRN     07/08/17 1212          This chart was  dictated using voice recognition software/Dragon. Despite best efforts to proofread, errors can occur which can change the meaning. Any change was purely unintentional.    Enid Derry, PA-C 07/08/17 1231    Rockne Menghini, MD 07/08/17 [phone]

## 2017-08-13 ENCOUNTER — Other Ambulatory Visit: Payer: Self-pay

## 2017-08-13 ENCOUNTER — Encounter: Payer: Self-pay | Admitting: Emergency Medicine

## 2017-08-13 ENCOUNTER — Emergency Department
Admission: EM | Admit: 2017-08-13 | Discharge: 2017-08-13 | Disposition: A | Discharge status: Home / Self Care | Payer: Self-pay | Attending: Emergency Medicine | Admitting: Emergency Medicine

## 2017-08-13 DIAGNOSIS — J45909 Unspecified asthma, uncomplicated: Secondary | ICD-10-CM | POA: Insufficient documentation

## 2017-08-13 DIAGNOSIS — Z87891 Personal history of nicotine dependence: Secondary | ICD-10-CM | POA: Insufficient documentation

## 2017-08-13 DIAGNOSIS — M10072 Idiopathic gout, left ankle and foot: Secondary | ICD-10-CM | POA: Insufficient documentation

## 2017-08-13 DIAGNOSIS — Z79899 Other long term (current) drug therapy: Secondary | ICD-10-CM | POA: Insufficient documentation

## 2017-08-13 MED ORDER — COLCHICINE 0.6 MG PO TABS
1.2000 mg | ORAL_TABLET | Freq: Once | ORAL | Status: AC
  Administered 2017-08-13: 1.2 mg via ORAL
  Filled 2017-08-13: qty 2

## 2017-08-13 MED ORDER — OXYCODONE-ACETAMINOPHEN 5-325 MG PO TABS
1.0000 | ORAL_TABLET | Freq: Three times a day (TID) | ORAL | 0 refills | Status: AC | PRN
Start: 2017-08-13 — End: 2017-08-18

## 2017-08-13 MED ORDER — OXYCODONE-ACETAMINOPHEN 5-325 MG PO TABS
1.0000 | ORAL_TABLET | Freq: Once | ORAL | Status: AC
  Administered 2017-08-13: 1 via ORAL
  Filled 2017-08-13: qty 1

## 2017-08-13 MED ORDER — OMEPRAZOLE 40 MG PO CPDR: 40.0000 mg | DELAYED_RELEASE_CAPSULE | Freq: Every day | ORAL | 1 refills | Status: DC

## 2017-08-13 MED ORDER — COLCHICINE 0.6 MG PO TABS: 0.6000 mg | ORAL_TABLET | Freq: Every day | ORAL | 1 refills | Status: DC

## 2017-08-13 NOTE — Discharge Instructions (Addendum)
Take the gout medicine daily as directed. Take the reflux medicine daily. [facility] for [facility] medical care.

## 2017-08-13 NOTE — ED Notes (Signed)
Pt has gout and has not been taking medication. Pain to Left foot

## 2017-08-13 NOTE — ED Triage Notes (Signed)
Patient ambulatory to triage with steady gait, without difficulty or distress noted; pt reports left foot pain with hx of same, st he ran out of his colchicine for gout

## 2017-08-13 NOTE — ED Provider Notes (Signed)
[facility] Emergency Department Provider Note ____________________________________________  Time seen: 2143  I have reviewed the triage vital signs and the nursing notes.  HISTORY  Chief Complaint  Foot Pain  HPI Jesus Singleton is a 32 y.o. male presents to the ED for evaluation of left great toe and foot pain, intermittently for the last month. He has a history of gout, and reports that he ran out of his previously prescribed colchicine. He denies any recent trauma, but has been unable to report to work this week due to pain and the inability to don his work shoes. He denies fevers, chills, nausea, chest pain, or paresthesias.   Past Medical History:  Diagnosis Date  . Asthma   . Gout     There are no active problems to display for this patient.   Past Surgical History:  Procedure Laterality Date  . bilateral leg surgery      Prior to Admission medications   Medication Sig Start Date End Date Taking? Authorizing Provider  albuterol (PROVENTIL HFA;VENTOLIN HFA) 108 (90 BASE) MCG/ACT inhaler Inhale 2 puffs into the lungs every 6 (six) hours as needed for wheezing or shortness of breath. 01/25/15   Tommi Rumps, PA-C  colchicine 0.6 MG tablet Take 1 tablet (0.6 mg total) by mouth daily. Take 2 tabs PO x 1, then 1 tab PO 1 hour later x 1  Max: 1.8 mg total dose per attack, do not repeat within 3 days. 08/13/17 10/12/17  Kayline Sheer, Charlesetta Ivory, PA-C  omeprazole (PRILOSEC) 40 MG capsule Take 1 capsule (40 mg total) by mouth daily. 08/13/17 10/12/17  Letricia Krinsky, Charlesetta Ivory, PA-C  oxyCODONE-acetaminophen (PERCOCET) 5-325 MG tablet Take 1 tablet by mouth every 8 (eight) hours as needed for up to 5 days for severe pain. 08/13/17 08/18/17  Kirstina Leinweber, Charlesetta Ivory, PA-C  ranitidine (ZANTAC) 150 MG tablet Take 1 tablet (150 mg total) by mouth 2 (two) times daily. 05/08/16   Tymara Saur, Charlesetta Ivory, PA-C  traMADol (ULTRAM) 50 MG tablet Take 1 tablet (50 mg total) by mouth  every 6 (six) hours as needed for moderate pain. 03/14/17   Joni Reining, PA-C    Allergies Shellfish allergy and Tramadol  No family history on file.  Social History Social History   Tobacco Use  . Smoking status: Former Smoker    Packs/day: 0.20    Types: Cigarettes  . Smokeless tobacco: Never Used  Substance Use Topics  . Alcohol use: No  . Drug use: No    Review of Systems  Constitutional: Negative for fever. Cardiovascular: Negative for chest pain. Respiratory: Negative for shortness of breath. Gastrointestinal: Negative for abdominal pain, vomiting and diarrhea. Genitourinary: Negative for dysuria. Musculoskeletal: Negative for back pain. Left foot pain as above. Skin: Negative for rash. Neurological: Negative for headaches, focal weakness or numbness. ____________________________________________  PHYSICAL EXAM:  VITAL SIGNS: ED Triage Vitals  Enc Vitals Group     BP 08/13/17 2121 (!) 161/95     Pulse Rate 08/13/17 2121 (!) 112     Resp --      Temp 08/13/17 2121 98.2 F (36.8 C)     Temp Source 08/13/17 2121 Oral     SpO2 08/13/17 2121 97 %     Weight 08/13/17 2120 281 lb (127.5 kg)     Height 08/13/17 2120 6\' 1"  (1.854 m)     Head Circumference --      Peak Flow --  Pain Score 08/13/17 2120 10     Pain Loc --      Pain Edu? --      Excl. in GC? --     Constitutional: Alert and oriented. Well appearing and in no distress. Head: Normocephalic and atraumatic. Cardiovascular: Normal rate, regular rhythm. Normal distal pulses. Respiratory: Normal respiratory effort. No wheezes/rales/rhonchi. Musculoskeletal: Right foot with erythema to the 1st MTP. Exquisitely tender to palp over the 1st joint.  Nontender with normal range of motion in all extremities.  Neurologic:  Normal gait without ataxia. Normal speech and language. No gross focal neurologic deficits are appreciated. Skin:  Skin is warm, dry and intact. No rash  noted. ____________________________________________  PROCEDURES  Procedures Colchicine 1.2 mg PO Percocet 5-325 mg PO ____________________________________________  INITIAL IMPRESSION / ASSESSMENT AND PLAN / ED COURSE  Patient with ED evaluation of a acute gout flare. He is discharged with a prescription for colchicine, Percocet (#15) and omeprazole for his GERD. [facility] is [facility].   I reviewed the patient's prescription history over the last 12 months in the multi-state controlled substances database(s) that includes [place], [place], [place], [place], [place], [place], [place], [place], [place], [place], [place], [place], and [place].  Results were notable for no recent narcotic prescriptions.  ____________________________________________  FINAL CLINICAL IMPRESSION(S) / ED DIAGNOSES  Final diagnoses:  Acute idiopathic gout of left foot      Karmen StabsMenshew, Charlesetta IvoryJenise V Bacon, PA-C 08/13/17 2339    Rockne MenghiniNorman, Anne-Caroline, MD 08/15/17 1511

## 2017-09-06 ENCOUNTER — Other Ambulatory Visit: Payer: Self-pay

## 2017-09-06 ENCOUNTER — Emergency Department
Admission: EM | Admit: 2017-09-06 | Discharge: 2017-09-06 | Disposition: A | Discharge status: Home / Self Care | Payer: Self-pay | Attending: Emergency Medicine | Admitting: Emergency Medicine

## 2017-09-06 ENCOUNTER — Encounter: Payer: Self-pay | Admitting: Emergency Medicine

## 2017-09-06 DIAGNOSIS — J45909 Unspecified asthma, uncomplicated: Secondary | ICD-10-CM | POA: Insufficient documentation

## 2017-09-06 DIAGNOSIS — Z87891 Personal history of nicotine dependence: Secondary | ICD-10-CM | POA: Insufficient documentation

## 2017-09-06 DIAGNOSIS — M10072 Idiopathic gout, left ankle and foot: Secondary | ICD-10-CM | POA: Insufficient documentation

## 2017-09-06 DIAGNOSIS — Z79899 Other long term (current) drug therapy: Secondary | ICD-10-CM | POA: Insufficient documentation

## 2017-09-06 MED ORDER — COLCHICINE 0.6 MG PO TABS
1.2000 mg | ORAL_TABLET | Freq: Once | ORAL | Status: AC
  Administered 2017-09-06: 1.2 mg via ORAL
  Filled 2017-09-06: qty 2

## 2017-09-06 MED ORDER — NAPROXEN 500 MG PO TABS: 500.0000 mg | ORAL_TABLET | Freq: Two times a day (BID) | ORAL | Status: DC

## 2017-09-06 MED ORDER — NAPROXEN 500 MG PO TABS
500.0000 mg | ORAL_TABLET | Freq: Once | ORAL | Status: AC
  Administered 2017-09-06: 500 mg via ORAL
  Filled 2017-09-06: qty 1

## 2017-09-06 NOTE — ED Notes (Signed)
See triage note  States he was here approx 3 weeks ago with a gout flare  States sx's had gotten better while taking the cholchine but he is out of meds  Left foot red and swollen

## 2017-09-06 NOTE — Discharge Instructions (Signed)
Advised to establish care for your chronic medical condition consistent with gout.  Call [facility] health service on Monday morning to schedule appointment.  Make sure you had a proper documentations to establish care.

## 2017-09-06 NOTE — ED Triage Notes (Signed)
Pt states he has run out of his gout medicine, left foot pain at this time.

## 2017-09-06 NOTE — ED Provider Notes (Signed)
[facility] Emergency Department Provider Note   ____________________________________________   First MD Initiated Contact with Patient 09/06/17 1458     (approximate)  I have reviewed the triage vital signs and the nursing notes.   HISTORY  Chief Complaint Foot Pain    HPI Jesus Singleton is a 32 y.o. male patient complain recurrence of left foot pain secondary to gout.  Patient was seen in this facility on 08/13/2017 and at [facility] on 08/16/2017.  Patient was advised both times to follow-up with the [facility] healthcare system to establish care.  Patient state he tried two appointments.  The first appointment he did not have proper documentation.  The second visit he forgot his wallet.  Patient rates his pain as a 9/10.  Patient describes his pain is "aching".  Patient state he is out of the previous prescription medication from his last 2 visits.  Since the last visit the facility patient become allergic to tramadol.  Past Medical History:  Diagnosis Date  . Asthma   . Gout     There are no active problems to display for this patient.   Past Surgical History:  Procedure Laterality Date  . bilateral leg surgery      Prior to Admission medications   Medication Sig Start Date End Date Taking? Authorizing Provider  albuterol (PROVENTIL HFA;VENTOLIN HFA) 108 (90 BASE) MCG/ACT inhaler Inhale 2 puffs into the lungs every 6 (six) hours as needed for wheezing or shortness of breath. 01/25/15   Tommi Rumps, PA-C  colchicine 0.6 MG tablet Take 1 tablet (0.6 mg total) by mouth daily. Take 2 tabs PO x 1, then 1 tab PO 1 hour later x 1  Max: 1.8 mg total dose per attack, do not repeat within 3 days. 08/13/17 10/12/17  Menshew, Charlesetta Ivory, PA-C  naproxen (NAPROSYN) 500 MG tablet Take 1 tablet (500 mg total) by mouth 2 (two) times daily with a meal. 09/06/17   Joni Reining, PA-C  omeprazole (PRILOSEC) 40 MG capsule Take 1 capsule (40 mg total) by mouth daily.  08/13/17 10/12/17  Menshew, Charlesetta Ivory, PA-C  ranitidine (ZANTAC) 150 MG tablet Take 1 tablet (150 mg total) by mouth 2 (two) times daily. 05/08/16   Menshew, Charlesetta Ivory, PA-C  traMADol (ULTRAM) 50 MG tablet Take 1 tablet (50 mg total) by mouth every 6 (six) hours as needed for moderate pain. 03/14/17   Joni Reining, PA-C    Allergies Shellfish allergy and Tramadol  No family history on file.  Social History Social History   Tobacco Use  . Smoking status: Former Smoker    Packs/day: 0.20    Types: Cigarettes  . Smokeless tobacco: Never Used  Substance Use Topics  . Alcohol use: No  . Drug use: No    Review of Systems Constitutional: No fever/chills Eyes: No visual changes. ENT: No sore throat. Cardiovascular: Denies chest pain. Respiratory: Denies shortness of breath. Gastrointestinal: No abdominal pain.  No nausea, no vomiting.  No diarrhea.  No constipation. Genitourinary: Negative for dysuria. Musculoskeletal: Left foot pain. Skin: Negative for rash. Neurological: Negative for headaches, focal weakness or numbness. Endocrine:Gout. Hematological/Lymphatic: Allergic/Immunilogical: Shellfish and tramadol ____________________________________________   PHYSICAL EXAM:  VITAL SIGNS: ED Triage Vitals  Enc Vitals Group     BP 09/06/17 1405 (!) 139/92     Pulse Rate 09/06/17 1405 98     Resp 09/06/17 1405 16     Temp 09/06/17 1405 98.4 F (36.9  C)     Temp Source 09/06/17 1405 Oral     SpO2 09/06/17 1405 95 %     Weight 09/06/17 1405 285 lb (129.3 kg)     Height 09/06/17 1405 6' (1.829 m)     Head Circumference --      Peak Flow --      Pain Score 09/06/17 1416 9     Pain Loc --      Pain Edu? --      Excl. in GC? --     Constitutional: Alert and oriented. Well appearing and in no acute distress.  Morbid obesity. Cardiovascular: Normal rate, regular rhythm. Grossly normal heart sounds.  Good peripheral circulation. Respiratory: Normal respiratory  effort.  No retractions. Lungs CTAB. Musculoskeletal: No lower extremity tenderness nor edema.  No joint effusions. Neurologic:  Normal speech and language. No gross focal neurologic deficits are appreciated. No gait instability. Skin:  Skin is warm, dry and intact. No rash noted. Psychiatric: Mood and affect are normal. Speech and behavior are normal.  ____________________________________________   LABS (all labs ordered are listed, but only abnormal results are displayed)  Labs Reviewed - No data to display ____________________________________________  EKG   ____________________________________________  RADIOLOGY  ED MD interpretation:    Official radiology report(s): No results found.  ____________________________________________   PROCEDURES  Procedure(s) performed: None  Procedures  Critical Care performed: No  ____________________________________________   INITIAL IMPRESSION / ASSESSMENT AND PLAN / ED COURSE  As part of my medical decision making, I reviewed the following data within the electronic MEDICAL RECORD NUMBER    Patient presents with left foot pain.  Patient states history of gout.  Review of records show no recent uric acid levels.  Patient given discharge care instructions.  Patient given prescription for naproxen.  Patient advised to establish care with the [facility] health care system.      ____________________________________________   FINAL CLINICAL IMPRESSION(S) / ED DIAGNOSES  Final diagnoses:  Acute idiopathic gout of left foot     ED Discharge Orders        Ordered    naproxen (NAPROSYN) 500 MG tablet  2 times daily with meals     09/06/17 1509       Note:  This document was prepared using Dragon voice recognition software and may include unintentional dictation errors.    Joni Reining, PA-C 09/06/17 1521    Schaevitz, Myra Rude, MD 09/06/17 [phone]

## 2017-12-17 ENCOUNTER — Emergency Department
Admission: EM | Admit: 2017-12-17 | Discharge: 2017-12-17 | Disposition: A | Discharge status: Home / Self Care | Payer: Self-pay | Attending: Emergency Medicine | Admitting: Emergency Medicine

## 2017-12-17 ENCOUNTER — Other Ambulatory Visit: Payer: Self-pay

## 2017-12-17 DIAGNOSIS — M109 Gout, unspecified: Secondary | ICD-10-CM | POA: Insufficient documentation

## 2017-12-17 DIAGNOSIS — J45909 Unspecified asthma, uncomplicated: Secondary | ICD-10-CM | POA: Insufficient documentation

## 2017-12-17 DIAGNOSIS — F17210 Nicotine dependence, cigarettes, uncomplicated: Secondary | ICD-10-CM | POA: Insufficient documentation

## 2017-12-17 MED ORDER — INDOMETHACIN 25 MG PO CAPS: 25.0000 mg | ORAL_CAPSULE | Freq: Three times a day (TID) | ORAL | 0 refills | Status: DC

## 2017-12-17 MED ORDER — HYDROCODONE-ACETAMINOPHEN 5-325 MG PO TABS: 1.0000 | ORAL_TABLET | Freq: Four times a day (QID) | ORAL | 0 refills | Status: DC | PRN

## 2017-12-17 NOTE — ED Provider Notes (Signed)
[facility] Emergency Department Provider Note  ____________________________________________   First MD Initiated Contact with Patient 12/17/17 1008     (approximate)  I have reviewed the triage vital signs and the nursing notes.   HISTORY  Chief Complaint Gout  HPI Jesus Singleton is a 32 y.o. male presents with left great toe gout flareup for the last 3 days.  Patient states that he has a history of gout.  He states that he is unable to afford colchicine.  He also does not have a PCP at this time.  He rates his pain as a 9/10.   Past Medical History:  Diagnosis Date  . Asthma   . Gout     There are no active problems to display for this patient.   Past Surgical History:  Procedure Laterality Date  . bilateral leg surgery      Prior to Admission medications   Medication Sig Start Date End Date Taking? Authorizing Provider  albuterol (PROVENTIL HFA;VENTOLIN HFA) 108 (90 BASE) MCG/ACT inhaler Inhale 2 puffs into the lungs every 6 (six) hours as needed for wheezing or shortness of breath. 01/25/15   Tommi Rumps, PA-C  HYDROcodone-acetaminophen (NORCO/VICODIN) 5-325 MG tablet Take 1 tablet by mouth every 6 (six) hours as needed for moderate pain. 12/17/17   Tommi Rumps, PA-C  indomethacin (INDOCIN) 25 MG capsule Take 1 capsule (25 mg total) by mouth 3 (three) times daily with meals. 12/17/17   Tommi Rumps, PA-C  omeprazole (PRILOSEC) 40 MG capsule Take 1 capsule (40 mg total) by mouth daily. 08/13/17 10/12/17  Menshew, Charlesetta Ivory, PA-C    Allergies Shellfish allergy and Tramadol  No family history on file.  Social History Social History   Tobacco Use  . Smoking status: Current Some Day Smoker    Packs/day: 0.20    Types: Cigarettes  . Smokeless tobacco: Never Used  Substance Use Topics  . Alcohol use: No  . Drug use: No    Review of Systems Constitutional: No fever/chills Cardiovascular: Denies chest  pain. Respiratory: Denies shortness of breath. Gastrointestinal: No abdominal pain.  No nausea, no vomiting.  No diarrhea.  No constipation. Musculoskeletal: Positive for left great toe pain. Skin: Positive for erythema left great toe. Neurological: Negative for headaches, focal weakness or numbness. ____________________________________________   PHYSICAL EXAM:  VITAL SIGNS: ED Triage Vitals  Enc Vitals Group     BP 12/17/17 0937 (!) 149/83     Pulse Rate 12/17/17 0937 87     Resp 12/17/17 0937 16     Temp 12/17/17 0937 97.7 F (36.5 C)     Temp Source 12/17/17 0937 Oral     SpO2 12/17/17 0937 96 %     Weight 12/17/17 0938 285 lb (129.3 kg)     Height 12/17/17 0938 6' (1.829 m)     Head Circumference --      Peak Flow --      Pain Score 12/17/17 0937 9     Pain Loc --      Pain Edu? --      Excl. in GC? --     Constitutional: Alert and oriented. Well appearing and in no acute distress. Eyes: Conjunctivae are normal.  Head: Atraumatic. Neck: No stridor.   Cardiovascular: Normal rate, regular rhythm. Grossly normal heart sounds.  Good peripheral circulation. Respiratory: Normal respiratory effort.  No retractions. Lungs CTAB. Musculoskeletal: Examination of left foot there is moderate tenderness with erythema at the  left great toe MP joint.  Area is warm to touch.  Skin is intact.  Motor or sensory function intact. Neurologic:  Normal speech and language. No gross focal neurologic deficits are appreciated. Skin:  Skin is warm, dry and intact.  Psychiatric: Mood and affect are normal. Speech and behavior are normal.  ____________________________________________   LABS (all labs ordered are listed, but only abnormal results are displayed)  Labs Reviewed - No data to display  PROCEDURES  Procedure(s) performed: None  Procedures  Critical Care performed: No  ____________________________________________   INITIAL IMPRESSION / ASSESSMENT AND PLAN / ED COURSE  As  part of my medical decision making, I reviewed the following data within the electronic MEDICAL RECORD NUMBER Notes from prior ED visits and [place] Controlled Substance Database  Patient was given a prescription for indomethacin 25 mg 3 times daily with food.  He is aware that should he develop any GI discomfort he is to discontinue taking this medication.  He was also given a prescription for Norco 1 every 6 hours as needed for pain #12.  [facility]  ____________________________________________   FINAL CLINICAL IMPRESSION(S) / ED DIAGNOSES  Final diagnoses:  Gouty arthritis of left great toe     ED Discharge Orders        Ordered    indomethacin (INDOCIN) 25 MG capsule  3 times daily with meals     12/17/17 1048    HYDROcodone-acetaminophen (NORCO/VICODIN) 5-325 MG tablet  Every 6 hours PRN,   Status:  Discontinued     12/17/17 1048    HYDROcodone-acetaminophen (NORCO/VICODIN) 5-325 MG tablet  Every 6 hours PRN     12/17/17 1049       Note:  This document was prepared using Dragon voice recognition software and may include unintentional dictation errors.    Tommi RumpsSummers, Westlynn Fifer L, PA-C 12/17/17 1420    Merrily Brittleifenbark, Neil, MD 12/17/17 1525

## 2017-12-17 NOTE — ED Notes (Signed)
See triage note   Presents with possible gout to left foot   States pain started about 3 days ago  Denies any injury  But states he ate red meat prior to pain starting

## 2017-12-17 NOTE — Discharge Instructions (Addendum)
[facility]  Call to make an appointment and also fill out any necessary papers.  Begin taking Indocin 25 mg 3 times daily with food.  Also Norco 1 every 6 hours as needed for moderate pain.  Do not take this medication and drive or operate machinery as it can cause drowsiness.  Read information on a low purine diet which will also help control your gout.

## 2017-12-17 NOTE — ED Triage Notes (Signed)
Pt c/o left foot pain for the past 3 days , states it's my gout acting up..Jesus Singleton

## 2018-01-03 ENCOUNTER — Emergency Department
Admission: EM | Admit: 2018-01-03 | Discharge: 2018-01-03 | Disposition: A | Discharge status: Home / Self Care | Payer: Self-pay | Attending: Emergency Medicine | Admitting: Emergency Medicine

## 2018-01-03 ENCOUNTER — Encounter: Payer: Self-pay | Admitting: Emergency Medicine

## 2018-01-03 ENCOUNTER — Other Ambulatory Visit: Payer: Self-pay

## 2018-01-03 DIAGNOSIS — Z4800 Encounter for change or removal of nonsurgical wound dressing: Secondary | ICD-10-CM | POA: Insufficient documentation

## 2018-01-03 DIAGNOSIS — Z5189 Encounter for other specified aftercare: Secondary | ICD-10-CM

## 2018-01-03 NOTE — ED Provider Notes (Signed)
[facility] Emergency Department Provider Note   ____________________________________________    I have reviewed the triage vital signs and the nursing notes.   HISTORY  Chief Complaint Wound Check     HPI Jesus Singleton is a 32 y.o. male who presents because he noticed blood from his scrotum today.  Patient reports he had a bowel movement and was then going to get in the shower.  He noticed blood running down his leg and initially thought it was from his anus but when he wiped he found it was primarily at the inferior aspect of his scrotum.  Denies injury to the area.   Past Medical History:  Diagnosis Date  . Asthma   . Gout   . Gout     There are no active problems to display for this patient.   Past Surgical History:  Procedure Laterality Date  . bilateral leg surgery      Prior to Admission medications   Medication Sig Start Date End Date Taking? Authorizing Provider  albuterol (PROVENTIL HFA;VENTOLIN HFA) 108 (90 BASE) MCG/ACT inhaler Inhale 2 puffs into the lungs every 6 (six) hours as needed for wheezing or shortness of breath. 01/25/15   Tommi Rumps, PA-C  HYDROcodone-acetaminophen (NORCO/VICODIN) 5-325 MG tablet Take 1 tablet by mouth every 6 (six) hours as needed for moderate pain. 12/17/17   Tommi Rumps, PA-C  indomethacin (INDOCIN) 25 MG capsule Take 1 capsule (25 mg total) by mouth 3 (three) times daily with meals. 12/17/17   Tommi Rumps, PA-C  omeprazole (PRILOSEC) 40 MG capsule Take 1 capsule (40 mg total) by mouth daily. 08/13/17 10/12/17  Menshew, Charlesetta Ivory, PA-C     Allergies Shellfish allergy and Tramadol  No family history on file.  Social History Social History   Tobacco Use  . Smoking status: Current Some Day Smoker    Packs/day: 0.20    Types: Cigarettes  . Smokeless tobacco: Never Used  Substance Use Topics  . Alcohol use: No  . Drug use: No    Review of Systems  Constitutional: No  dizziness   Gastrointestinal: No abdominal pain.  No nausea, no vomiting.   Genitourinary: No injury Musculoskeletal: Negative for back pain. Skin: Negative for rash.     ____________________________________________   PHYSICAL EXAM:  VITAL SIGNS: ED Triage Vitals  Enc Vitals Group     BP 01/03/18 2204 (!) 145/80     Pulse Rate 01/03/18 2204 90     Resp 01/03/18 2205 18     Temp 01/03/18 2205 98.2 F (36.8 C)     Temp Source 01/03/18 2205 Oral     SpO2 01/03/18 2204 98 %     Weight 01/03/18 2203 131.5 kg (290 lb)     Height 01/03/18 2203 1.829 m (6')     Head Circumference --      Peak Flow --      Pain Score 01/03/18 2203 0     Pain Loc --      Pain Edu? --      Excl. in GC? --     Constitutional: Alert and oriented. No acute distress. Pleasant and interactive    Mouth/Throat: Mucous membranes are moist.   Cardiovascular: Normal rate, regular rhythm.  Respiratory: Normal respiratory effort.  No retractions. GI: Small hemorrhoid at 6:00, nonbleeding Genitourinary: Very small punctate lesion that looks like inflamed follicle with tiny scab overlying it, no active bleeding, no swelling or erythema  Neurologic:  Normal speech and language.   Skin:  Skin is warm, dry and intact.    ____________________________________________   LABS (all labs ordered are listed, but only abnormal results are displayed)  Labs Reviewed - No data to display ____________________________________________  EKG   ____________________________________________  RADIOLOGY   ____________________________________________   PROCEDURES  Procedure(s) performed: No  Procedures   Critical Care performed: No ____________________________________________   INITIAL IMPRESSION / ASSESSMENT AND PLAN / ED COURSE  Pertinent labs & imaging results that were available during my care of the patient were reviewed by me and considered in my medical decision making (see chart for  details).  No bleeding at this time, superficial wound, okay for discharge   ____________________________________________   FINAL CLINICAL IMPRESSION(S) / ED DIAGNOSES  Final diagnoses:  Visit for wound check      NEW MEDICATIONS STARTED DURING THIS VISIT:  Discharge Medication List as of 01/03/2018 10:13 PM       Note:  This document was prepared using Dragon voice recognition software and may include unintentional dictation errors.    Jene EveryKinner, Benjiman Sedgwick, MD 01/03/18 [id number]

## 2018-01-03 NOTE — ED Notes (Signed)
Small wound noted to left testicle. No bleeding noted at this time. Possible hair follicle wound per Dr. Cyril LoosenKinner.

## 2018-01-03 NOTE — ED Triage Notes (Signed)
Patient states that about 20 minutes ago he was getting ready to take a shower and that he started bleeding from his testicle.

## 2018-03-22 ENCOUNTER — Emergency Department (HOSPITAL_COMMUNITY)
Admission: EM | Admit: 2018-03-22 | Discharge: 2018-03-22 | Disposition: A | Discharge status: Home / Self Care | Payer: Self-pay | Attending: Emergency Medicine | Admitting: Emergency Medicine

## 2018-03-22 ENCOUNTER — Other Ambulatory Visit: Payer: Self-pay

## 2018-03-22 ENCOUNTER — Encounter (HOSPITAL_COMMUNITY): Payer: Self-pay | Admitting: Emergency Medicine

## 2018-03-22 DIAGNOSIS — M79671 Pain in right foot: Secondary | ICD-10-CM | POA: Insufficient documentation

## 2018-03-22 DIAGNOSIS — M109 Gout, unspecified: Secondary | ICD-10-CM | POA: Insufficient documentation

## 2018-03-22 DIAGNOSIS — F17210 Nicotine dependence, cigarettes, uncomplicated: Secondary | ICD-10-CM | POA: Insufficient documentation

## 2018-03-22 DIAGNOSIS — Z79899 Other long term (current) drug therapy: Secondary | ICD-10-CM | POA: Insufficient documentation

## 2018-03-22 DIAGNOSIS — J45909 Unspecified asthma, uncomplicated: Secondary | ICD-10-CM | POA: Insufficient documentation

## 2018-03-22 MED ORDER — PREDNISONE 20 MG PO TABS
40.0000 mg | ORAL_TABLET | Freq: Once | ORAL | Status: AC
  Administered 2018-03-22: 40 mg via ORAL
  Filled 2018-03-22: qty 2

## 2018-03-22 MED ORDER — NAPROXEN 500 MG PO TABS: 500.0000 mg | ORAL_TABLET | Freq: Two times a day (BID) | ORAL | 0 refills | Status: DC

## 2018-03-22 MED ORDER — NAPROXEN 250 MG PO TABS
500.0000 mg | ORAL_TABLET | Freq: Once | ORAL | Status: AC
  Administered 2018-03-22: 500 mg via ORAL
  Filled 2018-03-22: qty 2

## 2018-03-22 MED ORDER — PREDNISONE 20 MG PO TABS: 40.0000 mg | ORAL_TABLET | Freq: Every day | ORAL | 0 refills | Status: DC

## 2018-03-22 MED ORDER — HYDROCODONE-ACETAMINOPHEN 5-325 MG PO TABS: ORAL_TABLET | ORAL | 0 refills | Status: DC

## 2018-03-22 NOTE — ED Triage Notes (Addendum)
Patient c/o right foot pain due to gout since yesterday. Per patient hx of gout in which he  takes colchicine and naproxen . Per patient has ran out of naproxen. Denies any fevers. Patient also states he has been taking hydrocodone with last dose yesterday.

## 2018-03-22 NOTE — ED Provider Notes (Signed)
[facility] EMERGENCY DEPARTMENT Provider Note   CSN: [id number] Arrival date & time: 03/22/18  1101     History   Chief Complaint Chief Complaint  Patient presents with  . Foot Pain    HPI SYE SCHROEPFER is a 32 y.o. male.  HPI   AHMADOU BOLZ is a 32 y.o. male who presents to the Emergency Department complaining of sudden onset of pain and swelling to his right foot and great toe.  He reports history of gout and states that he ate pork a few days ago and believes this contributed to his current symptoms.  He typically takes naproxen and colchicine for his gout, but states he is ran out of naproxen and the colchicine has not helped.  He denies known injury.  Pain is worse with palpation and weightbearing.  Pain is localized to the first metatarsal head.  He denies numbness or weakness of his foot  Past Medical History:  Diagnosis Date  . Asthma   . Gout   . Gout     There are no active problems to display for this patient.   Past Surgical History:  Procedure Laterality Date  . bilateral leg surgery        Home Medications    Prior to Admission medications   Medication Sig Start Date End Date Taking? Authorizing Provider  albuterol (PROVENTIL HFA;VENTOLIN HFA) 108 (90 BASE) MCG/ACT inhaler Inhale 2 puffs into the lungs every 6 (six) hours as needed for wheezing or shortness of breath. 01/25/15   Tommi Rumps, PA-C  HYDROcodone-acetaminophen (NORCO/VICODIN) 5-325 MG tablet Take 1 tablet by mouth every 6 (six) hours as needed for moderate pain. 12/17/17   Tommi Rumps, PA-C  indomethacin (INDOCIN) 25 MG capsule Take 1 capsule (25 mg total) by mouth 3 (three) times daily with meals. 12/17/17   Tommi Rumps, PA-C  omeprazole (PRILOSEC) 40 MG capsule Take 1 capsule (40 mg total) by mouth daily. 08/13/17 10/12/17  Menshew, Charlesetta Ivory, PA-C    Family History No family history on file.  Social History Social History   Tobacco Use  . Smoking status:  Current Some Day Smoker    Packs/day: 0.20    Types: Cigarettes  . Smokeless tobacco: Never Used  Substance Use Topics  . Alcohol use: No  . Drug use: No     Allergies   Shellfish allergy and Tramadol   Review of Systems Review of Systems  Constitutional: Negative for chills and fever.  Genitourinary: Negative for difficulty urinating and dysuria.  Musculoskeletal: Positive for arthralgias (Right great toe) and joint swelling.  Skin: Negative for color change and wound.  Neurological: Negative for weakness and numbness.     Physical Exam Updated Vital Signs BP 134/70   Pulse 85   Temp 97.9 F (36.6 C) (Oral)   Resp 18   Ht 5\' 9"  (1.753 m)   Wt 127 kg   SpO2 100%   BMI 41.35 kg/m   Physical Exam  Constitutional: He is oriented to person, place, and time. He appears well-developed and well-nourished. No distress.  HENT:  Head: Normocephalic and atraumatic.  Cardiovascular: Normal rate, regular rhythm and intact distal pulses.  Pulmonary/Chest: Effort normal and breath sounds normal.  Musculoskeletal: He exhibits edema and tenderness. He exhibits no deformity.  Focal tenderness to palpation and mild erythema at the first metatarsal head of the right foot.  No open wounds.  Lateral foot and ankle nontender.  Neurological: He is alert  and oriented to person, place, and time. No sensory deficit.  Skin: Skin is warm and dry. Capillary refill takes less than 2 seconds.  Nursing note and vitals reviewed.    ED Treatments / Results  Labs (all labs ordered are listed, but only abnormal results are displayed) Labs Reviewed - No data to display  EKG None  Radiology No results found.  Procedures Procedures (including critical care time)  Medications Ordered in ED Medications  predniSONE (DELTASONE) tablet 40 mg (has no administration in time range)  naproxen (NAPROSYN) tablet 500 mg (has no administration in time range)     Initial Impression / Assessment and  Plan / ED Course  I have reviewed the triage vital signs and the nursing notes.  Pertinent labs & imaging results that were available during my care of the patient were reviewed by me and considered in my medical decision making (see chart for details).     Patient with history of gout and pain to the right great toe after eating pork.  Neurovascularly intact.  No concerning symptoms for cellulitis.  Will treat with prednisone and anti-inflammatory, short course pain medication patient agrees to PCP follow-up if needed.  Final Clinical Impressions(s) / ED Diagnoses   Final diagnoses:  Right foot pain  Acute gout involving toe of right foot, unspecified cause    ED Discharge Orders    None       Pauline Aus, PA-C 03/22/18 1336    Samuel Jester, DO 03/25/18 1554

## 2018-03-22 NOTE — Discharge Instructions (Addendum)
Start the prednisone tomorrow.  You may contact 1 of the providers on the list provided to establish primary care.  [facility] Primary Care Doctor List    Kari Baars MD. Specialty: Pulmonary Disease Contact information: [address]  [address]  [place] [address]  [phone]   Syliva Overman, MD. Specialty: [facility] Medicine Contact information: [address]  [place] [address]  [phone]   Lilyan Punt, MD. Specialty: [facility] Medicine Contact information: [address]  [place] [address]  [phone]   Avon Gully, MD Specialty: Internal Medicine Contact information: [address] [place] [address]  [phone]   Catalina Pizza, MD. Specialty: Internal Medicine Contact information: [address]  [place] [address]  [phone]    [facility] (Dr. Selena Batten) Specialty: Family Medicine Contact information: [address]  [place] [address]  [phone]   John Giovanni, MD. Specialty: [facility] Medicine Contact information: [address]  [address]  [place] [address]  [phone]   Carylon Perches, MD. Specialty: Internal Medicine Contact information: [address]  [address]  [place] [address]  [phone]    [facility]  [address] [place], [place] [address] [phone]  Services [facility] offers a variety of basic health services.  Services include but are not limited to: Blood pressure checks  Heart rate checks  Blood sugar checks  Urine analysis  Rapid strep tests  Pregnancy tests.  Health education and referrals  People needing more complex services will be directed to a physician online. Using these virtual visits, doctors can evaluate and prescribe medicine and treatments. There will be no medication on-site, though [facility] will help patients fill their prescriptions at  little to no cost.   For More information please go to: [email or web address]

## 2018-05-26 ENCOUNTER — Other Ambulatory Visit: Payer: Self-pay

## 2018-05-26 ENCOUNTER — Emergency Department (HOSPITAL_COMMUNITY)
Admission: EM | Admit: 2018-05-26 | Discharge: 2018-05-26 | Disposition: A | Discharge status: Home / Self Care | Payer: Self-pay | Attending: Emergency Medicine | Admitting: Emergency Medicine

## 2018-05-26 ENCOUNTER — Encounter (HOSPITAL_COMMUNITY): Payer: Self-pay | Admitting: Emergency Medicine

## 2018-05-26 DIAGNOSIS — Z79899 Other long term (current) drug therapy: Secondary | ICD-10-CM | POA: Insufficient documentation

## 2018-05-26 DIAGNOSIS — M25462 Effusion, left knee: Secondary | ICD-10-CM | POA: Insufficient documentation

## 2018-05-26 DIAGNOSIS — J45909 Unspecified asthma, uncomplicated: Secondary | ICD-10-CM | POA: Insufficient documentation

## 2018-05-26 DIAGNOSIS — M25562 Pain in left knee: Secondary | ICD-10-CM

## 2018-05-26 DIAGNOSIS — F17210 Nicotine dependence, cigarettes, uncomplicated: Secondary | ICD-10-CM | POA: Insufficient documentation

## 2018-05-26 MED ORDER — HYDROCODONE-ACETAMINOPHEN 5-325 MG PO TABS: 1.0000 | ORAL_TABLET | Freq: Four times a day (QID) | ORAL | 0 refills | Status: DC | PRN

## 2018-05-26 MED ORDER — PREDNISONE 10 MG PO TABS
60.0000 mg | ORAL_TABLET | Freq: Once | ORAL | Status: AC
  Administered 2018-05-26: 60 mg via ORAL
  Filled 2018-05-26: qty 1

## 2018-05-26 MED ORDER — PREDNISONE 50 MG PO TABS: ORAL_TABLET | ORAL | 0 refills | Status: DC

## 2018-05-26 MED ORDER — HYDROMORPHONE HCL 1 MG/ML IJ SOLN
1.0000 mg | Freq: Once | INTRAMUSCULAR | Status: AC
  Administered 2018-05-26: 1 mg via INTRAMUSCULAR
  Filled 2018-05-26: qty 1

## 2018-05-26 NOTE — ED Triage Notes (Signed)
Pt states he was recently treated for Gout and now presents with L. Knee swelling and pain.

## 2018-05-26 NOTE — ED Provider Notes (Signed)
[facility] EMERGENCY DEPARTMENT Provider Note   CSN: [id number] Arrival date & time: 05/26/18  0302    Patient gives permission to perform history and physical in front of family/friend  History   Chief Complaint Chief Complaint  Patient presents with  . Knee Pain    HPI Jesus Singleton is a 33 y.o. male.  The history is provided by the patient and a significant other.  Knee Pain  Location:  Knee Time since incident:  1 day Injury: no   Knee location:  L knee Pain details:    Quality:  Pressure   Severity:  Severe   Onset quality:  Gradual   Timing:  Constant   Progression:  Worsening Chronicity:  New Dislocation: no   Relieved by:  Rest Worsened by:  Bearing weight Associated symptoms: swelling   Associated symptoms: no fever    Patient with previous history of asthma and gout presents with left knee pain and swelling.  No trauma.  No redness.  No fevers or vomiting.  He has long history of gout that usually affects his feet. He reports he has had arthrocentesis that has confirmed gout in the past.  It has never affected his knee.  He reports swelling and pain with movement of the knee. Past Medical History:  Diagnosis Date  . Asthma   . Gout   . Gout     There are no active problems to display for this patient.   Past Surgical History:  Procedure Laterality Date  . bilateral leg surgery          Home Medications    Prior to Admission medications   Medication Sig Start Date End Date Taking? Authorizing Provider  albuterol (PROVENTIL HFA;VENTOLIN HFA) 108 (90 BASE) MCG/ACT inhaler Inhale 2 puffs into the lungs every 6 (six) hours as needed for wheezing or shortness of breath. 01/25/15  Yes Tommi Rumps, PA-C  HYDROcodone-acetaminophen (NORCO/VICODIN) 5-325 MG tablet Take 1 tablet by mouth every 6 (six) hours as needed. 05/26/18   Zadie Rhine, MD  omeprazole (PRILOSEC) 40 MG capsule Take 1 capsule (40 mg total) by mouth daily. 08/13/17 10/12/17   Menshew, Charlesetta Ivory, PA-C  predniSONE (DELTASONE) 50 MG tablet One tablet po daily for 6 days 05/26/18   Zadie Rhine, MD    Family History No family history on file.  Social History Social History   Tobacco Use  . Smoking status: Current Some Day Smoker    Packs/day: 0.20    Types: Cigarettes  . Smokeless tobacco: Never Used  Substance Use Topics  . Alcohol use: No  . Drug use: No     Allergies   Shellfish allergy and Tramadol   Review of Systems Review of Systems  Constitutional: Negative for fever.  Gastrointestinal: Negative for vomiting.  Musculoskeletal: Positive for arthralgias and joint swelling.  All other systems reviewed and are negative.    Physical Exam Updated Vital Signs BP (!) 144/97 (BP Location: Right Arm)   Pulse (!) 102   Temp 97.8 F (36.6 C) (Oral)   Resp 18   Ht 1.854 m (6\' 1" )   Wt 127 kg   SpO2 100%   BMI 36.94 kg/m   Physical Exam CONSTITUTIONAL: Well developed/well nourished HEAD: Normocephalic/atraumatic EYES: EOMI/PERRL ENMT: Mucous membranes moist NECK: supple no meningeal signs SPINE/BACK:entire spine nontender CV: S1/S2 noted, no murmurs/rubs/gallops noted LUNGS: Lungs are clear to auscultation bilaterally, no apparent distress ABDOMEN: soft, nontender, no rebound or guarding, bowel sounds noted  throughout abdomen GU:no cva tenderness NEURO: Pt is awake/alert/appropriate, moves all extremitiesx4.  No facial droop.   EXTREMITIES: pulses normal/equal, full ROM Left knee has an effusion.  There is no erythema.  Flexion of left knee is limited due to pain and swelling.  Distal pulses are equal and intact.  There is no calf tenderness or edema.  There is no tenderness along the left thigh. SKIN: warm, color normal PSYCH: no abnormalities of mood noted, alert and oriented to situation   ED Treatments / Results  Labs (all labs ordered are listed, but only abnormal results are displayed) Labs Reviewed - No data to  display  EKG None  Radiology No results found.  Procedures Procedures   Medications Ordered in ED Medications  HYDROmorphone (DILAUDID) injection 1 mg (1 mg Intramuscular Given 05/26/18 0450)  predniSONE (DELTASONE) tablet 60 mg (60 mg Oral Given 05/26/18 0449)     Initial Impression / Assessment and Plan / ED Course  I have reviewed the triage vital signs and the nursing notes.   PT with atraumatic left knee pain and swelling.  He just finished a course of medications for gout in his feet Suspect this represent gouty arthritis or other reactive arthritis  I have low suspicion for septic arthritis Will place on course of steroids as well as pain medications.  Advised rest, elevation Use crutches he has at home for the next several days.  He would likely benefit from daily preventative therapy for gout Final Clinical Impressions(s) / ED Diagnoses   Final diagnoses:  Acute pain of left knee  Effusion of left knee    ED Discharge Orders         Ordered    predniSONE (DELTASONE) 50 MG tablet     05/26/18 0442    HYDROcodone-acetaminophen (NORCO/VICODIN) 5-325 MG tablet  Every 6 hours PRN     05/26/18 0457           Zadie Rhine, MD 05/26/18 0507

## 2018-07-04 ENCOUNTER — Emergency Department (HOSPITAL_COMMUNITY): Payer: Self-pay

## 2018-07-04 ENCOUNTER — Encounter (HOSPITAL_COMMUNITY): Payer: Self-pay | Admitting: Emergency Medicine

## 2018-07-04 ENCOUNTER — Emergency Department (HOSPITAL_COMMUNITY)
Admission: EM | Admit: 2018-07-04 | Discharge: 2018-07-04 | Disposition: A | Discharge status: Home / Self Care | Payer: Self-pay | Attending: Emergency Medicine | Admitting: Emergency Medicine

## 2018-07-04 ENCOUNTER — Other Ambulatory Visit: Payer: Self-pay

## 2018-07-04 DIAGNOSIS — M25462 Effusion, left knee: Secondary | ICD-10-CM | POA: Insufficient documentation

## 2018-07-04 DIAGNOSIS — M25562 Pain in left knee: Secondary | ICD-10-CM

## 2018-07-04 DIAGNOSIS — F17210 Nicotine dependence, cigarettes, uncomplicated: Secondary | ICD-10-CM | POA: Insufficient documentation

## 2018-07-04 DIAGNOSIS — J45909 Unspecified asthma, uncomplicated: Secondary | ICD-10-CM | POA: Insufficient documentation

## 2018-07-04 DIAGNOSIS — Z79899 Other long term (current) drug therapy: Secondary | ICD-10-CM | POA: Insufficient documentation

## 2018-07-04 MED ORDER — IBUPROFEN 800 MG PO TABS: 800.0000 mg | ORAL_TABLET | Freq: Three times a day (TID) | ORAL | 0 refills | Status: DC

## 2018-07-04 MED ORDER — HYDROCODONE-ACETAMINOPHEN 7.5-325 MG PO TABS: 1.0000 | ORAL_TABLET | Freq: Four times a day (QID) | ORAL | 0 refills | Status: DC | PRN

## 2018-07-04 MED ORDER — HYDROMORPHONE HCL 1 MG/ML IJ SOLN
1.0000 mg | Freq: Once | INTRAMUSCULAR | Status: AC
  Administered 2018-07-04: 1 mg via INTRAMUSCULAR
  Filled 2018-07-04: qty 1

## 2018-07-04 NOTE — ED Triage Notes (Signed)
Pt reports L knee pain, was seen several weeks ago for gout in the L knee. No redness noted to knee. Pt denies injury. Here d/t pain and no pain medication.

## 2018-07-04 NOTE — Discharge Instructions (Addendum)
Wear the brace with walking or standing.  Elevate and apply ice packs on/off.  Call Dr. Mort Sawyers office to arrange a follow-up appt for next week

## 2018-07-07 NOTE — ED Provider Notes (Signed)
[facility] EMERGENCY DEPARTMENT Provider Note   CSN: [id number] Arrival date & time: 07/04/18  1545    History   Chief Complaint Chief Complaint  Patient presents with  . Knee Pain    HPI Jesus Singleton is a 33 y.o. male.     HPI   Jesus Singleton is a 33 y.o. male with hx of gout, who presents to the Emergency Department complaining of pain of the left knee. Pain has been accompanied with swelling of the knee. Symptoms have been present for several weeks.   He describes a throbbing pain with attempted bending and weight bearing.  He was seen here several weeks ago for same and treated with steroids which did not help his symptoms.  He denies recent injury, but admits to frequent bending and walking due to his job.  He denies fever, chills, redness of the joint and excessive warmth.  No other therapies tried.     Past Medical History:  Diagnosis Date  . Asthma   . Gout   . Gout     There are no active problems to display for this patient.   Past Surgical History:  Procedure Laterality Date  . bilateral leg surgery        Home Medications    Prior to Admission medications   Medication Sig Start Date End Date Taking? Authorizing Provider  albuterol (PROVENTIL HFA;VENTOLIN HFA) 108 (90 BASE) MCG/ACT inhaler Inhale 2 puffs into the lungs every 6 (six) hours as needed for wheezing or shortness of breath. 01/25/15   Tommi Rumps, PA-C  HYDROcodone-acetaminophen (NORCO) 7.5-325 MG tablet Take 1 tablet by mouth every 6 (six) hours as needed for moderate pain. 07/04/18   Hayven Fatima, PA-C  ibuprofen (ADVIL,MOTRIN) 800 MG tablet Take 1 tablet (800 mg total) by mouth 3 (three) times daily. Take with food 07/04/18   Joevon Holliman, PA-C  omeprazole (PRILOSEC) 40 MG capsule Take 1 capsule (40 mg total) by mouth daily. 08/13/17 10/12/17  Menshew, Charlesetta Ivory, PA-C  predniSONE (DELTASONE) 50 MG tablet One tablet po daily for 6 days 05/26/18   Zadie Rhine, MD     Family History History reviewed. No pertinent family history.  Social History Social History   Tobacco Use  . Smoking status: Current Some Day Smoker    Packs/day: 0.20    Types: Cigarettes  . Smokeless tobacco: Never Used  Substance Use Topics  . Alcohol use: No  . Drug use: No     Allergies   Shellfish allergy and Tramadol   Review of Systems Review of Systems  Constitutional: Negative for chills and fever.  Musculoskeletal: Positive for arthralgias (left knee pain) and joint swelling.  Skin: Negative for color change and wound.  Neurological: Negative for weakness and numbness.     Physical Exam Updated Vital Signs BP 122/78 (BP Location: Right Arm)   Pulse 92   Temp 98.6 F (37 C) (Temporal)   Resp 17   Ht 6' (1.829 m)   Wt 129.3 kg   SpO2 98%   BMI 38.65 kg/m   Physical Exam Vitals signs and nursing note reviewed.  Constitutional:      General: He is not in acute distress.    Appearance: He is well-developed.  Cardiovascular:     Rate and Rhythm: Normal rate and regular rhythm.     Pulses: Normal pulses.  Pulmonary:     Effort: Pulmonary effort is normal.     Breath sounds: Normal breath  sounds.  Musculoskeletal:        General: Swelling and tenderness present. No signs of injury.     Comments: ttp of the anterior left knee.  Mild to moderate edema.  No erythema, or step-off deformity.  DP pulse brisk, distal sensation intact. Calf is soft and NT.  Skin:    General: Skin is warm and dry.     Capillary Refill: Capillary refill takes less than 2 seconds.     Findings: No erythema or rash.  Neurological:     General: No focal deficit present.     Mental Status: He is alert.     Sensory: No sensory deficit.     Motor: No weakness or abnormal muscle tone.      ED Treatments / Results  Labs (all labs ordered are listed, but only abnormal results are displayed) Labs Reviewed - No data to display  EKG None  Radiology Dg Knee Complete 4  Views Left  Result Date: 07/04/2018 CLINICAL DATA:  Left knee pain, swelling. EXAM: LEFT KNEE - COMPLETE 4+ VIEW COMPARISON:  None. FINDINGS: Spurring throughout the left knee. Moderate joint effusion. No acute bony abnormality. Specifically, no fracture, subluxation, or dislocation. IMPRESSION: Mild degenerative changes and moderate joint effusion. No acute bony abnormality. Electronically Signed   By: Charlett Nose M.D.   On: 07/04/2018 17:09     Procedures Procedures (including critical care time)  Medications Ordered in ED Medications  HYDROmorphone (DILAUDID) injection 1 mg (1 mg Intramuscular Given 07/04/18 1741)     Initial Impression / Assessment and Plan / ED Course  I have reviewed the triage vital signs and the nursing notes.  Pertinent labs & imaging results that were available during my care of the patient were reviewed by me and considered in my medical decision making (see chart for details).        Pt with chronic effusion of the knee.  No excessive warmth or erythema.  Hx of gout.  No improvement after steriods.  XR neg for fx of disloc, NV intact.  Doubt septic joint or indication for emergent joint aspiration.  Knee sleeve applied, and crutches provided for support.  pt prefers local orthopedic f/u.  Return precautions discussed.    Final Clinical Impressions(s) / ED Diagnoses   Final diagnoses:  Acute pain of left knee  Effusion of left knee    ED Discharge Orders         Ordered    ibuprofen (ADVIL,MOTRIN) 800 MG tablet  3 times daily     07/04/18 1746    HYDROcodone-acetaminophen (NORCO) 7.5-325 MG tablet  Every 6 hours PRN     07/04/18 1746           Pauline Aus, PA-C 07/07/18 2245    Donnetta Hutching, MD 07/08/18 1119

## 2018-08-04 ENCOUNTER — Encounter (HOSPITAL_COMMUNITY): Payer: Self-pay

## 2018-08-04 ENCOUNTER — Encounter: Payer: Self-pay | Admitting: Intensive Care

## 2018-08-04 ENCOUNTER — Other Ambulatory Visit: Payer: Self-pay

## 2018-08-04 ENCOUNTER — Emergency Department (HOSPITAL_COMMUNITY)
Admission: EM | Admit: 2018-08-04 | Discharge: 2018-08-04 | Disposition: A | Discharge status: Home / Self Care | Payer: Self-pay | Attending: Emergency Medicine | Admitting: Emergency Medicine

## 2018-08-04 ENCOUNTER — Emergency Department
Admission: EM | Admit: 2018-08-04 | Discharge: 2018-08-04 | Disposition: A | Discharge status: Home / Self Care | Payer: Self-pay | Attending: Emergency Medicine | Admitting: Emergency Medicine

## 2018-08-04 DIAGNOSIS — Z79899 Other long term (current) drug therapy: Secondary | ICD-10-CM | POA: Insufficient documentation

## 2018-08-04 DIAGNOSIS — Z87891 Personal history of nicotine dependence: Secondary | ICD-10-CM | POA: Insufficient documentation

## 2018-08-04 DIAGNOSIS — J45909 Unspecified asthma, uncomplicated: Secondary | ICD-10-CM | POA: Insufficient documentation

## 2018-08-04 DIAGNOSIS — R112 Nausea with vomiting, unspecified: Secondary | ICD-10-CM | POA: Insufficient documentation

## 2018-08-04 DIAGNOSIS — F17210 Nicotine dependence, cigarettes, uncomplicated: Secondary | ICD-10-CM | POA: Insufficient documentation

## 2018-08-04 DIAGNOSIS — K920 Hematemesis: Secondary | ICD-10-CM | POA: Insufficient documentation

## 2018-08-04 LAB — URINALYSIS, COMPLETE (UACMP) WITH MICROSCOPIC
Bacteria, UA: NONE SEEN
Bilirubin Urine: NEGATIVE
Glucose, UA: NEGATIVE mg/dL
Ketones, ur: NEGATIVE mg/dL
Leukocytes,Ua: NEGATIVE
Nitrite: NEGATIVE
Protein, ur: NEGATIVE mg/dL
Specific Gravity, Urine: 1.02 (ref 1.005–1.030)
Squamous Epithelial / HPF: NONE SEEN (ref 0–5)
pH: 5 (ref 5.0–8.0)

## 2018-08-04 LAB — COMPREHENSIVE METABOLIC PANEL
ALT: 33 U/L (ref 0–44)
AST: 24 U/L (ref 15–41)
Albumin: 4.2 g/dL (ref 3.5–5.0)
Alkaline Phosphatase: 66 U/L (ref 38–126)
Anion gap: 9 (ref 5–15)
BUN: 11 mg/dL (ref 6–20)
CO2: 26 mmol/L (ref 22–32)
Calcium: 9.2 mg/dL (ref 8.9–10.3)
Chloride: 102 mmol/L (ref 98–111)
Creatinine, Ser: 1.06 mg/dL (ref 0.61–1.24)
GFR calc Af Amer: >60 mL/min (ref >60)
GFR calc non Af Amer: >60 mL/min (ref >60)
Glucose, Bld: 98 mg/dL (ref 70–99)
Potassium: 3.6 mmol/L (ref 3.5–5.1)
Sodium: 137 mmol/L (ref 135–145)
Total Bilirubin: 0.8 mg/dL (ref 0.3–1.2)
Total Protein: 8.5 g/dL — ABNORMAL HIGH (ref 6.5–8.1)

## 2018-08-04 LAB — CBC
HCT: 43.9 % (ref 39.0–52.0)
Hemoglobin: 14.5 g/dL (ref 13.0–17.0)
MCH: 27.9 pg (ref 26.0–34.0)
MCHC: 33 g/dL (ref 30.0–36.0)
MCV: 84.6 fL (ref 80.0–100.0)
Platelets: 374 10*3/uL (ref 150–400)
RBC: 5.19 MIL/uL (ref 4.22–5.81)
RDW: 13.9 % (ref 11.5–15.5)
WBC: 12.3 10*3/uL — ABNORMAL HIGH (ref 4.0–10.5)
nRBC: 0 % (ref 0.0–0.2)

## 2018-08-04 LAB — LIPASE, BLOOD: Lipase: 23 U/L (ref 11–51)

## 2018-08-04 MED ORDER — ONDANSETRON 4 MG PO TBDP: 4.0000 mg | ORAL_TABLET | Freq: Three times a day (TID) | ORAL | 0 refills | Status: DC | PRN

## 2018-08-04 MED ORDER — ONDANSETRON 8 MG PO TBDP: 8.0000 mg | ORAL_TABLET | Freq: Once | ORAL | Status: DC

## 2018-08-04 MED ORDER — SODIUM CHLORIDE 0.9 % IV BOLUS
1000.0000 mL | Freq: Once | INTRAVENOUS | Status: AC
  Administered 2018-08-04: 1000 mL via INTRAVENOUS

## 2018-08-04 MED ORDER — ONDANSETRON HCL 4 MG/2ML IJ SOLN
4.0000 mg | Freq: Once | INTRAMUSCULAR | Status: AC
  Administered 2018-08-04: 4 mg via INTRAVENOUS
  Filled 2018-08-04: qty 2

## 2018-08-04 NOTE — ED Notes (Signed)
Pt is given a cup of water to do the PO challenge.

## 2018-08-04 NOTE — ED Notes (Signed)
Pt requesting to leave, states he does not want to "have a finger stuck up my butt"; pt informed if his symptoms become worse to come back to be evaluated; pt verbalized understanding

## 2018-08-04 NOTE — ED Triage Notes (Signed)
Pt says that he has been vomiting after he eats or drinks for 2 weeks. Says occasionally he vomits up some blood. Feels better afterwards.. denies diarrhea.    hasn't went to see a pcp yet waiting on insurance.

## 2018-08-04 NOTE — Discharge Instructions (Addendum)
For 2 days, please strictly adhere to a clear liquid diet.  After that, take 2 days of a full liquid diet and then advance your diet as tolerated to a bland diet.  You may take Zofran for any nausea or vomiting.  Return to the emergency department if you develop any severe pain, lightheadedness or fainting, fever, or any other symptoms concerning to you.

## 2018-08-04 NOTE — ED Triage Notes (Signed)
Patient c/o emesis X2 weeks. Denies fever at home. Patient reports everything he eats comes right back up.

## 2018-08-04 NOTE — ED Provider Notes (Signed)
[facility] EMERGENCY DEPARTMENT Provider Note   CSN: [id number] Arrival date & time: 08/04/18  1941  Time seen 5:30 AM  History   Chief Complaint Chief Complaint  Patient presents with   Emesis    2 weeks    HPI Jesus Singleton is a 33 y.o. male.     HPI patient states for the past 2 weeks every day he has nausea and he has vomiting as soon as he eats or drinks anything.  He states is happening about twice a day.  He states it does not matter what food it is.  He has nausea throughout the day but denies abdominal pain.  He states the past couple days he has been seen some spots of blood when he vomits.  He denies any rectal bleeding, melena, diarrhea.  He denies any weight loss.  He states he does have heartburn intermittently and the last time he had it was a week ago.  He bought something over-the-counter at [facility] for heartburn but he has not been taking it.  He denies any abdominal pain.  He denies feeling dizzy or lightheaded.  He denies taking any nonsteroidal anti-inflammatory drugs over-the-counter, smoking, or alcohol use.  He has not taken any medications for his gout for at least a month.  He states the last time he vomited was about 20 to 30 minutes prior to coming to the ED.  PCP Patient, No Pcp Per   Past Medical History:  Diagnosis Date   Asthma    Gout    Gout     There are no active problems to display for this patient.   Past Surgical History:  Procedure Laterality Date   bilateral leg surgery          Home Medications    Prior to Admission medications   Medication Sig Start Date End Date Taking? Authorizing Provider  albuterol (PROVENTIL HFA;VENTOLIN HFA) 108 (90 BASE) MCG/ACT inhaler Inhale 2 puffs into the lungs every 6 (six) hours as needed for wheezing or shortness of breath. 01/25/15   Tommi Rumps, PA-C  HYDROcodone-acetaminophen (NORCO) 7.5-325 MG tablet Take 1 tablet by mouth every 6 (six) hours as needed for moderate pain.  07/04/18   Triplett, Tammy, PA-C  ibuprofen (ADVIL,MOTRIN) 800 MG tablet Take 1 tablet (800 mg total) by mouth 3 (three) times daily. Take with food 07/04/18   Triplett, Tammy, PA-C  omeprazole (PRILOSEC) 40 MG capsule Take 1 capsule (40 mg total) by mouth daily. 08/13/17 10/12/17  Menshew, Charlesetta Ivory, PA-C  predniSONE (DELTASONE) 50 MG tablet One tablet po daily for 6 days 05/26/18   Zadie Rhine, MD    Family History History reviewed. No pertinent family history.  Social History Social History   Tobacco Use   Smoking status: Current Some Day Smoker    Packs/day: 0.20    Types: Cigarettes   Smokeless tobacco: Never Used  Substance Use Topics   Alcohol use: No   Drug use: No  works in [employer]   Allergies   Shellfish allergy and Tramadol   Review of Systems Review of Systems  All other systems reviewed and are negative.    Physical Exam Updated Vital Signs BP 125/81 (BP Location: Left Arm)    Pulse 96    Temp 98.3 F (36.8 C) (Oral)    Resp 20    Ht 6' (1.829 m)    Wt 129.3 kg    SpO2 96%    BMI 38.65 kg/m  Orthostatic vital signs were ordered but patient left before they were done.   Physical Exam Vitals signs and nursing note reviewed.  Constitutional:      Appearance: Normal appearance. He is obese.  HENT:     Head: Normocephalic and atraumatic.     Right Ear: External ear normal.     Left Ear: External ear normal.     Nose: Nose normal.     Mouth/Throat:     Mouth: Mucous membranes are moist.     Pharynx: No oropharyngeal exudate or posterior oropharyngeal erythema.  Eyes:     Extraocular Movements: Extraocular movements intact.     Conjunctiva/sclera: Conjunctivae normal.     Pupils: Pupils are equal, round, and reactive to light.  Neck:     Musculoskeletal: Normal range of motion and neck supple.  Cardiovascular:     Rate and Rhythm: Normal rate and regular rhythm.     Heart sounds: No murmur.  Pulmonary:     Effort: Pulmonary effort  is normal. No respiratory distress.     Breath sounds: Normal breath sounds.  Abdominal:     General: Bowel sounds are normal. There is no distension.     Palpations: Abdomen is soft.     Tenderness: There is no abdominal tenderness.  Musculoskeletal: Normal range of motion.  Skin:    General: Skin is warm and dry.  Neurological:     General: No focal deficit present.     Mental Status: He is alert and oriented to person, place, and time.     Cranial Nerves: No cranial nerve deficit.  Psychiatric:        Mood and Affect: Mood normal.        Behavior: Behavior normal.        Thought Content: Thought content normal.      ED Treatments / Results  Labs (all labs ordered are listed, but only abnormal results are displayed) Labs Reviewed  COMPREHENSIVE METABOLIC PANEL  LIPASE, BLOOD  CBC WITH DIFFERENTIAL/PLATELET  POC OCCULT BLOOD, ED    EKG None  Radiology No results found.  Procedures Procedures (including critical care time)  Medications Ordered in ED Medications  ondansetron (ZOFRAN-ODT) disintegrating tablet 8 mg (has no administration in time range)     Initial Impression / Assessment and Plan / ED Course  I have reviewed the triage vital signs and the nursing notes.  Pertinent labs & imaging results that were available during my care of the patient were reviewed by me and considered in my medical decision making (see chart for details).       Laboratory testing was ordered including a Hemoccult.  6:15 AM I walked by his room and it was empty.  Nurse reports patient refused to have blood work drawn or have a rectal exam done for the Hemoccult.  She states he left.  Final Clinical Impressions(s) / ED Diagnoses   Final diagnoses:  Intractable vomiting with nausea, unspecified vomiting type  Hematemesis with nausea    Pt left AMA   Devoria Albe, MD 08/04/18 [phone]

## 2018-08-04 NOTE — ED Provider Notes (Signed)
[facility] Emergency Department Provider Note  ____________________________________________  Time seen: Approximately 5:42 PM  I have reviewed the triage vital signs and the nursing notes.   HISTORY  Chief Complaint Emesis    HPI Jesus Singleton is a 33 y.o. male with obesity and asthma presenting with vomiting.  The patient reports that for the past 2 weeks, anytime he has tried to eat anything, it "comes back up."  He reports trying to eat smaller meals, then switching to only healthy things like salad, without any improvement.  He has not had any constipation or diarrhea and has been having normal bowel movements.  He has no abdominal pain, fevers or chills, or dysuria.  He reports that he tried marijuana to stimulate his appetite, but was not regularly using marijuana prior to starting his vomiting.  He has not had any cough or cold symptoms.  He was seen at [facility] and left AMA when he was told he would need a rectal examination because at that time he was reporting blood in his stool; at this time he is no longer having blood in his stool.  Past Medical History:  Diagnosis Date  . Asthma   . Gout   . Gout     There are no active problems to display for this patient.   Past Surgical History:  Procedure Laterality Date  . bilateral leg surgery      Current Outpatient Rx  . Order #: [id number] Class: Print  . Order #: [id number] Class: Normal  . Order #: [id number] Class: Normal  . Order #: [id number] Class: Print  . Order #: [id number] Class: Print  . Order #: [id number] Class: Normal    Allergies Shellfish allergy and Tramadol  History reviewed. No pertinent family history.  Social History Social History   Tobacco Use  . Smoking status: Former Smoker    Packs/day: 0.20    Types: Cigarettes    Last attempt to quit: 07/14/2018    Years since quitting: 0.0  . Smokeless tobacco: Never Used  Substance Use Topics  . Alcohol use: No  .  Drug use: No    Review of Systems Constitutional: No fever/chills.  No lightheadedness or syncope. Eyes: No visual changes. ENT: No sore throat. No congestion or rhinorrhea. Cardiovascular: Denies chest pain. Denies palpitations. Respiratory: Denies shortness of breath.  No cough. Gastrointestinal: No abdominal pain.  Positive nausea, positive vomiting.  No diarrhea.  No constipation. Genitourinary: Negative for dysuria. Musculoskeletal: Negative for back pain. Skin: Negative for rash. Neurological: Negative for headaches. No focal numbness, tingling or weakness.     ____________________________________________   PHYSICAL EXAM:  VITAL SIGNS: ED Triage Vitals  Enc Vitals Group     BP 08/04/18 1557 (!) 133/91     Pulse Rate 08/04/18 1557 (!) 107     Resp 08/04/18 1557 18     Temp 08/04/18 1557 99.1 F (37.3 C)     Temp Source 08/04/18 1557 Oral     SpO2 08/04/18 1557 97 %     Weight 08/04/18 1558 280 lb (127 kg)     Height 08/04/18 1558 6' (1.829 m)     Head Circumference --      Peak Flow --      Pain Score 08/04/18 1558 0     Pain Loc --      Pain Edu? --      Excl. in GC? --     Constitutional: Alert and oriented. Answers questions appropriately. Eyes: Conjunctivae are  normal.  EOMI. No scleral icterus. Head: Atraumatic. Nose: No congestion/rhinnorhea. Mouth/Throat: Mucous membranes are moist.  Neck: No stridor.  Supple.   Cardiovascular: Normal rate, regular rhythm. No murmurs, rubs or gallops.  Respiratory: Normal respiratory effort.  No accessory muscle use or retractions. Lungs CTAB.  No wheezes, rales or ronchi. Gastrointestinal: Obese.  Soft, nontender and nondistended.  No guarding or rebound.  No peritoneal signs. Musculoskeletal: No LE edema. No ttp in the calves or palpable cords.  Negative Homan's sign. Neurologic:  A&Ox3.  Speech is clear.  Face and smile are symmetric.  EOMI.  Moves all extremities well. Skin:  Skin is warm, dry and intact. No rash  noted. Psychiatric: Mood and affect are normal. Speech and behavior are normal.  Normal judgement.  ____________________________________________   LABS (all labs ordered are listed, but only abnormal results are displayed)  Labs Reviewed  COMPREHENSIVE METABOLIC PANEL - Abnormal; Notable for the following components:      Result Value   Total Protein 8.5 (*)    All other components within normal limits  CBC - Abnormal; Notable for the following components:   WBC 12.3 (*)    All other components within normal limits  URINALYSIS, COMPLETE (UACMP) WITH MICROSCOPIC - Abnormal; Notable for the following components:   Color, Urine YELLOW (*)    APPearance CLEAR (*)    Hgb urine dipstick SMALL (*)    All other components within normal limits  LIPASE, BLOOD   ____________________________________________  EKG  ED ECG REPORT I, Anne-Caroline Sharma Covert, the attending physician, personally viewed and interpreted this ECG.   Date: 08/04/2018  EKG Time: 1757  Rate: 85  Rhythm: normal sinus rhythm  Axis: normal  Intervals:none  ST&T Change: No STEMI  ____________________________________________  RADIOLOGY  No results found.  ____________________________________________   PROCEDURES  Procedure(s) performed: None  Procedures  Critical Care performed: No ____________________________________________   INITIAL IMPRESSION / ASSESSMENT AND PLAN / ED COURSE  Pertinent labs & imaging results that were available during my care of the patient were reviewed by me and considered in my medical decision making (see chart for details).  33 y.o. male with obesity presenting with 2 weeks of nausea and vomiting without any other symptoms.  Overall, the patient is well-appearing.  He has no history of gastroparesis, or marijuana use.  GI infectious etiology including viral GI illness or foodborne illness is possible although less likely since he is have not having any diarrhea.  Here, the  patient is hemodynamically stable and afebrile, and he has no evidence of dehydration despite not being able to keep anything down for 2 weeks.  In fact, his laboratory studies are also reassuring with a normal potassium and blood sugar.  Does have a white blood cell count of 12.3, which is nonspecific.  My abdominal examination is reassuring without any evidence of a surgical or severe infectious pathology.  He states his nausea has resolved with Zofran and intravenous fluids, so do a p.o. challenge.  I talked to him about being discharged with Zofran, and following a diet plan with clear liquids, advancing to full and then bland diet as tolerated.  The patient will follow-up with GI as needed.  ____________________________________________  FINAL CLINICAL IMPRESSION(S) / ED DIAGNOSES  Final diagnoses:  None         NEW MEDICATIONS STARTED DURING THIS VISIT:  Discharge Medication List as of 08/04/2018  5:51 PM    START taking these medications   Details  ondansetron [facility]  ODT) 4 MG disintegrating tablet Take 1 tablet (4 mg total) by mouth every 8 (eight) hours as needed., Starting Tue 08/04/2018, Print          Rockne Menghini, MD 08/04/18 2340

## 2018-10-25 ENCOUNTER — Emergency Department
Admission: EM | Admit: 2018-10-25 | Discharge: 2018-10-25 | Disposition: A | Discharge status: Home / Self Care | Payer: BLUE CROSS/BLUE SHIELD | Attending: Emergency Medicine | Admitting: Emergency Medicine

## 2018-10-25 ENCOUNTER — Emergency Department: Payer: BLUE CROSS/BLUE SHIELD

## 2018-10-25 ENCOUNTER — Encounter: Payer: Self-pay | Admitting: Emergency Medicine

## 2018-10-25 ENCOUNTER — Other Ambulatory Visit: Payer: Self-pay

## 2018-10-25 DIAGNOSIS — R109 Unspecified abdominal pain: Secondary | ICD-10-CM | POA: Diagnosis not present

## 2018-10-25 DIAGNOSIS — Y93I9 Activity, other involving external motion: Secondary | ICD-10-CM | POA: Insufficient documentation

## 2018-10-25 DIAGNOSIS — Y92410 Unspecified street and highway as the place of occurrence of the external cause: Secondary | ICD-10-CM | POA: Insufficient documentation

## 2018-10-25 DIAGNOSIS — S8992XA Unspecified injury of left lower leg, initial encounter: Secondary | ICD-10-CM | POA: Diagnosis present

## 2018-10-25 DIAGNOSIS — M549 Dorsalgia, unspecified: Secondary | ICD-10-CM

## 2018-10-25 DIAGNOSIS — Y998 Other external cause status: Secondary | ICD-10-CM | POA: Insufficient documentation

## 2018-10-25 DIAGNOSIS — S82092A Other fracture of left patella, initial encounter for closed fracture: Secondary | ICD-10-CM

## 2018-10-25 DIAGNOSIS — R51 Headache: Secondary | ICD-10-CM | POA: Insufficient documentation

## 2018-10-25 DIAGNOSIS — R32 Unspecified urinary incontinence: Secondary | ICD-10-CM | POA: Insufficient documentation

## 2018-10-25 LAB — COMPREHENSIVE METABOLIC PANEL
ALT: 37 U/L (ref 0–44)
AST: 57 U/L — ABNORMAL HIGH (ref 15–41)
Albumin: 4.6 g/dL (ref 3.5–5.0)
Alkaline Phosphatase: 55 U/L (ref 38–126)
Anion gap: 10 (ref 5–15)
BUN: 10 mg/dL (ref 6–20)
CO2: 22 mmol/L (ref 22–32)
Calcium: 9.2 mg/dL (ref 8.9–10.3)
Chloride: 104 mmol/L (ref 98–111)
Creatinine, Ser: 1 mg/dL (ref 0.61–1.24)
GFR calc Af Amer: >60 mL/min (ref >60)
GFR calc non Af Amer: >60 mL/min (ref >60)
Glucose, Bld: 95 mg/dL (ref 70–99)
Potassium: 3.6 mmol/L (ref 3.5–5.1)
Sodium: 136 mmol/L (ref 135–145)
Total Bilirubin: 1.3 mg/dL — ABNORMAL HIGH (ref 0.3–1.2)
Total Protein: 8.1 g/dL (ref 6.5–8.1)

## 2018-10-25 LAB — CBC
HCT: 43.7 % (ref 39.0–52.0)
Hemoglobin: 14.7 g/dL (ref 13.0–17.0)
MCH: 27.8 pg (ref 26.0–34.0)
MCHC: 33.6 g/dL (ref 30.0–36.0)
MCV: 82.6 fL (ref 80.0–100.0)
Platelets: 347 10*3/uL (ref 150–400)
RBC: 5.29 MIL/uL (ref 4.22–5.81)
RDW: 13.7 % (ref 11.5–15.5)
WBC: 11.2 10*3/uL — ABNORMAL HIGH (ref 4.0–10.5)
nRBC: 0 % (ref 0.0–0.2)

## 2018-10-25 MED ORDER — CYCLOBENZAPRINE HCL 5 MG PO TABS: ORAL_TABLET | ORAL | 0 refills | Status: DC

## 2018-10-25 MED ORDER — DIAZEPAM 2 MG PO TABS
2.0000 mg | ORAL_TABLET | Freq: Once | ORAL | Status: AC
  Administered 2018-10-25: 2 mg via ORAL
  Filled 2018-10-25: qty 1

## 2018-10-25 MED ORDER — IOHEXOL 300 MG/ML  SOLN
125.0000 mL | Freq: Once | INTRAMUSCULAR | Status: AC | PRN
  Administered 2018-10-25: 125 mL via INTRAVENOUS

## 2018-10-25 MED ORDER — FENTANYL CITRATE (PF) 100 MCG/2ML IJ SOLN
50.0000 ug | Freq: Once | INTRAMUSCULAR | Status: AC
  Administered 2018-10-25: 50 ug via INTRAVENOUS
  Filled 2018-10-25: qty 2

## 2018-10-25 MED ORDER — IBUPROFEN 600 MG PO TABS: 600.0000 mg | ORAL_TABLET | Freq: Four times a day (QID) | ORAL | 0 refills | Status: DC | PRN

## 2018-10-25 MED ORDER — OXYCODONE-ACETAMINOPHEN 5-325 MG PO TABS: 1.0000 | ORAL_TABLET | ORAL | 0 refills | Status: DC | PRN

## 2018-10-25 NOTE — ED Notes (Signed)
Spoke with family of pt

## 2018-10-25 NOTE — Discharge Instructions (Addendum)
Your CTs are reassuring.  Your x-ray shows there may be a small fracture to the patella of your left knee.  There is also a lot of swelling.  Please wear knee immobilizer and use crutches.  Call Ortho in the morning for an appointment this week.  Ice and elevate leg tonight.  Please return the emergency department immediately for any worsening or changing of symptoms.
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
## 2018-10-29 ENCOUNTER — Emergency Department: Payer: BLUE CROSS/BLUE SHIELD

## 2018-10-29 ENCOUNTER — Emergency Department
Admission: EM | Admit: 2018-10-29 | Discharge: 2018-10-29 | Disposition: A | Discharge status: Home / Self Care | Payer: BLUE CROSS/BLUE SHIELD | Attending: Emergency Medicine | Admitting: Emergency Medicine

## 2018-10-29 ENCOUNTER — Encounter: Payer: Self-pay | Admitting: Emergency Medicine

## 2018-10-29 ENCOUNTER — Other Ambulatory Visit: Payer: Self-pay

## 2018-10-29 DIAGNOSIS — M5416 Radiculopathy, lumbar region: Secondary | ICD-10-CM | POA: Insufficient documentation

## 2018-10-29 DIAGNOSIS — R3911 Hesitancy of micturition: Secondary | ICD-10-CM | POA: Insufficient documentation

## 2018-10-29 DIAGNOSIS — R51 Headache: Secondary | ICD-10-CM | POA: Insufficient documentation

## 2018-10-29 DIAGNOSIS — J45909 Unspecified asthma, uncomplicated: Secondary | ICD-10-CM | POA: Insufficient documentation

## 2018-10-29 DIAGNOSIS — Z87891 Personal history of nicotine dependence: Secondary | ICD-10-CM | POA: Insufficient documentation

## 2018-10-29 DIAGNOSIS — S82002D Unspecified fracture of left patella, subsequent encounter for closed fracture with routine healing: Secondary | ICD-10-CM | POA: Diagnosis not present

## 2018-10-29 DIAGNOSIS — R1030 Lower abdominal pain, unspecified: Secondary | ICD-10-CM | POA: Diagnosis present

## 2018-10-29 LAB — COMPREHENSIVE METABOLIC PANEL
ALT: 25 U/L (ref 0–44)
AST: 23 U/L (ref 15–41)
Albumin: 4.2 g/dL (ref 3.5–5.0)
Alkaline Phosphatase: 55 U/L (ref 38–126)
Anion gap: 9 (ref 5–15)
BUN: 8 mg/dL (ref 6–20)
CO2: 26 mmol/L (ref 22–32)
Calcium: 9 mg/dL (ref 8.9–10.3)
Chloride: 99 mmol/L (ref 98–111)
Creatinine, Ser: 0.95 mg/dL (ref 0.61–1.24)
GFR calc Af Amer: >60 mL/min (ref >60)
GFR calc non Af Amer: >60 mL/min (ref >60)
Glucose, Bld: 90 mg/dL (ref 70–99)
Potassium: 3.7 mmol/L (ref 3.5–5.1)
Sodium: 134 mmol/L — ABNORMAL LOW (ref 135–145)
Total Bilirubin: 0.9 mg/dL (ref 0.3–1.2)
Total Protein: 7.6 g/dL (ref 6.5–8.1)

## 2018-10-29 LAB — URINALYSIS, COMPLETE (UACMP) WITH MICROSCOPIC
Bacteria, UA: NONE SEEN
Bilirubin Urine: NEGATIVE
Glucose, UA: NEGATIVE mg/dL
Ketones, ur: NEGATIVE mg/dL
Leukocytes,Ua: NEGATIVE
Nitrite: NEGATIVE
Protein, ur: NEGATIVE mg/dL
Specific Gravity, Urine: 1.018 (ref 1.005–1.030)
pH: 5 (ref 5.0–8.0)

## 2018-10-29 LAB — CBC WITH DIFFERENTIAL/PLATELET
Abs Immature Granulocytes: 0.02 10*3/uL (ref 0.00–0.07)
Basophils Absolute: 0 10*3/uL (ref 0.0–0.1)
Basophils Relative: 0 %
Eosinophils Absolute: 0.1 10*3/uL (ref 0.0–0.5)
Eosinophils Relative: 1 %
HCT: 43.4 % (ref 39.0–52.0)
Hemoglobin: 14.3 g/dL (ref 13.0–17.0)
Immature Granulocytes: 0 %
Lymphocytes Relative: 18 %
Lymphs Abs: 1.8 10*3/uL (ref 0.7–4.0)
MCH: 28 pg (ref 26.0–34.0)
MCHC: 32.9 g/dL (ref 30.0–36.0)
MCV: 84.9 fL (ref 80.0–100.0)
Monocytes Absolute: 1 10*3/uL (ref 0.1–1.0)
Monocytes Relative: 10 %
Neutro Abs: 7.3 10*3/uL (ref 1.7–7.7)
Neutrophils Relative %: 71 %
Platelets: 313 10*3/uL (ref 150–400)
RBC: 5.11 MIL/uL (ref 4.22–5.81)
RDW: 13.8 % (ref 11.5–15.5)
WBC: 10.3 10*3/uL (ref 4.0–10.5)
nRBC: 0 % (ref 0.0–0.2)

## 2018-10-29 MED ORDER — HYDROMORPHONE HCL 1 MG/ML IJ SOLN
1.0000 mg | Freq: Once | INTRAMUSCULAR | Status: AC
  Administered 2018-10-29: 1 mg via INTRAVENOUS
  Filled 2018-10-29: qty 1

## 2018-10-29 MED ORDER — ONDANSETRON HCL 4 MG/2ML IJ SOLN
4.0000 mg | Freq: Once | INTRAMUSCULAR | Status: AC
  Administered 2018-10-29: 4 mg via INTRAVENOUS
  Filled 2018-10-29: qty 2

## 2018-10-29 MED ORDER — KETOROLAC TROMETHAMINE 15 MG/ML IJ SOLN
15.0000 mg | Freq: Once | INTRAMUSCULAR | Status: AC
  Administered 2018-10-29: 19:00:00 15 mg via INTRAVENOUS
  Filled 2018-10-29: qty 1

## 2018-10-29 MED ORDER — OXYCODONE-ACETAMINOPHEN 5-325 MG PO TABS: 1.0000 | ORAL_TABLET | Freq: Four times a day (QID) | ORAL | 0 refills | Status: DC | PRN

## 2018-10-29 MED ORDER — NAPROXEN 375 MG PO TBEC: 375.0000 mg | DELAYED_RELEASE_TABLET | Freq: Two times a day (BID) | ORAL | 0 refills | Status: AC

## 2018-10-29 MED ORDER — DEXAMETHASONE SODIUM PHOSPHATE 10 MG/ML IJ SOLN
10.0000 mg | Freq: Once | INTRAMUSCULAR | Status: AC
  Administered 2018-10-29: 10 mg via INTRAVENOUS
  Filled 2018-10-29: qty 1

## 2018-10-29 MED ORDER — OXYCODONE-ACETAMINOPHEN 5-325 MG PO TABS
2.0000 | ORAL_TABLET | Freq: Once | ORAL | Status: AC
  Administered 2018-10-29: 2 via ORAL
  Filled 2018-10-29: qty 2

## 2018-10-29 MED ORDER — METHYLPREDNISOLONE 4 MG PO TBPK: ORAL_TABLET | ORAL | 0 refills | Status: DC

## 2018-10-29 NOTE — ED Notes (Signed)
Bladder scan max 34cc. Pt attempting to provide urine sample in urinal now.
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
## 2018-10-29 NOTE — Discharge Instructions (Signed)
Follow-up with orthopedics this week as discussed.  Take the medications as prescribed.  Avoid any heavy lifting.  Your MRI showed a lumbar disc herniation.  This is the likely cause of your left-sided pain.  Take the steroids as prescribed.  This should improve in 24 to 48 hours.  For your urination symptoms, discussed with orthopedics as well as your primary doctor.  If you continue have difficulty urinating, follow-up with a urologist in the next 1 week.  Numbers provided above.

## 2018-10-29 NOTE — ED Notes (Signed)
This RN looking for IV access.

## 2018-11-10 ENCOUNTER — Other Ambulatory Visit: Payer: Self-pay

## 2018-11-10 ENCOUNTER — Encounter: Payer: Self-pay | Admitting: Urology

## 2018-11-10 ENCOUNTER — Ambulatory Visit: Payer: BLUE CROSS/BLUE SHIELD | Admitting: Urology

## 2018-11-10 VITALS — BP 136/84 | HR 87 | Ht 72.0 in | Wt 314.0 lb

## 2018-11-10 DIAGNOSIS — R32 Unspecified urinary incontinence: Secondary | ICD-10-CM | POA: Diagnosis not present

## 2018-11-10 LAB — MICROSCOPIC EXAMINATION
Bacteria, UA: NONE SEEN
Epithelial Cells (non renal): NONE SEEN /hpf (ref 0–10)

## 2018-11-10 LAB — BLADDER SCAN AMB NON-IMAGING

## 2018-11-10 LAB — URINALYSIS, COMPLETE
Bilirubin, UA: NEGATIVE
Glucose, UA: NEGATIVE
Ketones, UA: NEGATIVE
Nitrite, UA: NEGATIVE
Protein,UA: NEGATIVE
Specific Gravity, UA: 1.02 (ref 1.005–1.030)
Urobilinogen, Ur: 0.2 mg/dL (ref 0.2–1.0)
pH, UA: 5.5 (ref 5.0–7.5)
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
## 2018-11-10 NOTE — Patient Instructions (Signed)
1. Minimize fluids after 7pm, and urinate twice right before bed 2. Take Mybetriq 1x daily for one month

## 2018-11-14 ENCOUNTER — Emergency Department
Admission: EM | Admit: 2018-11-14 | Discharge: 2018-11-14 | Disposition: A | Discharge status: Home / Self Care | Payer: BLUE CROSS/BLUE SHIELD | Attending: Emergency Medicine | Admitting: Emergency Medicine

## 2018-11-14 ENCOUNTER — Other Ambulatory Visit: Payer: Self-pay

## 2018-11-14 DIAGNOSIS — J45909 Unspecified asthma, uncomplicated: Secondary | ICD-10-CM | POA: Insufficient documentation

## 2018-11-14 DIAGNOSIS — R51 Headache: Secondary | ICD-10-CM | POA: Diagnosis not present

## 2018-11-14 DIAGNOSIS — R519 Headache, unspecified: Secondary | ICD-10-CM

## 2018-11-14 DIAGNOSIS — G8929 Other chronic pain: Secondary | ICD-10-CM | POA: Insufficient documentation

## 2018-11-14 DIAGNOSIS — Z760 Encounter for issue of repeat prescription: Secondary | ICD-10-CM | POA: Diagnosis not present

## 2018-11-14 DIAGNOSIS — R202 Paresthesia of skin: Secondary | ICD-10-CM | POA: Insufficient documentation

## 2018-11-14 DIAGNOSIS — M546 Pain in thoracic spine: Secondary | ICD-10-CM | POA: Insufficient documentation

## 2018-11-14 DIAGNOSIS — Z87891 Personal history of nicotine dependence: Secondary | ICD-10-CM | POA: Insufficient documentation

## 2018-11-14 MED ORDER — BUTALBITAL-APAP-CAFFEINE 50-325-40 MG PO TABS: 1.0000 | ORAL_TABLET | ORAL | 0 refills | Status: DC | PRN

## 2018-11-14 MED ORDER — LIDOCAINE 5 % EX PTCH: 1.0000 | MEDICATED_PATCH | CUTANEOUS | 0 refills | Status: DC

## 2018-11-14 MED ORDER — LIDOCAINE 5 % EX PTCH
1.0000 | MEDICATED_PATCH | CUTANEOUS | Status: DC
  Administered 2018-11-14: 1 via TRANSDERMAL
  Filled 2018-11-14: qty 1

## 2018-11-14 MED ORDER — OXYCODONE-ACETAMINOPHEN 5-325 MG PO TABS
1.0000 | ORAL_TABLET | Freq: Once | ORAL | Status: AC
  Administered 2018-11-14: 1 via ORAL
  Filled 2018-11-14: qty 1

## 2018-11-14 MED ORDER — DIAZEPAM 5 MG PO TABS
5.0000 mg | ORAL_TABLET | Freq: Once | ORAL | Status: AC
  Administered 2018-11-14: 5 mg via ORAL
  Filled 2018-11-14: qty 1

## 2018-11-14 MED ORDER — DIAZEPAM 5 MG PO TABS: 5.0000 mg | ORAL_TABLET | Freq: Three times a day (TID) | ORAL | 0 refills | Status: DC | PRN

## 2018-11-14 MED ORDER — ALBUTEROL SULFATE HFA 108 (90 BASE) MCG/ACT IN AERS: 2.0000 | INHALATION_SPRAY | Freq: Four times a day (QID) | RESPIRATORY_TRACT | 2 refills | Status: AC | PRN
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
## 2018-11-14 NOTE — Discharge Instructions (Signed)
You may take Fioricet as needed for headache. Take Valium as needed for muscle spasms. Apply Lidocaine patch to back as needed for back pain. Return to the ER for worsening symptoms, persistent vomiting, difficulty breathing or other concerns.
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
## 2018-11-14 NOTE — ED Notes (Signed)
No peripheral IV placed this visit.   Discharge instructions reviewed with patient. Questions fielded by this RN. Patient verbalizes understanding of instructions. Patient discharged home in stable condition per sung. No acute distress noted at time of discharge.   
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
## 2018-11-24 ENCOUNTER — Other Ambulatory Visit: Payer: Self-pay | Admitting: Orthopedic Surgery

## 2018-11-24 DIAGNOSIS — M2392 Unspecified internal derangement of left knee: Secondary | ICD-10-CM

## 2018-11-24 DIAGNOSIS — M25462 Effusion, left knee: Secondary | ICD-10-CM

## 2018-11-24 DIAGNOSIS — M25562 Pain in left knee: Secondary | ICD-10-CM

## 2018-12-02 ENCOUNTER — Ambulatory Visit
Admission: RE | Admit: 2018-12-02 | Discharge: 2018-12-02 | Disposition: A | Discharge status: Home / Self Care | Payer: BLUE CROSS/BLUE SHIELD | Source: Ambulatory Visit | Attending: Orthopedic Surgery | Admitting: Orthopedic Surgery

## 2018-12-02 ENCOUNTER — Other Ambulatory Visit: Payer: Self-pay

## 2018-12-02 DIAGNOSIS — M25562 Pain in left knee: Secondary | ICD-10-CM | POA: Diagnosis not present

## 2018-12-02 DIAGNOSIS — M25462 Effusion, left knee: Secondary | ICD-10-CM

## 2018-12-02 DIAGNOSIS — M2392 Unspecified internal derangement of left knee: Secondary | ICD-10-CM

## 2018-12-09 ENCOUNTER — Ambulatory Visit: Payer: BLUE CROSS/BLUE SHIELD | Admitting: Urology

## 2018-12-09 ENCOUNTER — Encounter: Payer: Self-pay | Admitting: Urology

## 2018-12-18 ENCOUNTER — Other Ambulatory Visit: Payer: BLUE CROSS/BLUE SHIELD

## 2018-12-21 ENCOUNTER — Other Ambulatory Visit: Payer: Self-pay

## 2018-12-21 ENCOUNTER — Encounter
Admission: RE | Admit: 2018-12-21 | Discharge: 2018-12-21 | Disposition: A | Discharge status: Home / Self Care | Payer: BLUE CROSS/BLUE SHIELD | Source: Ambulatory Visit | Attending: Orthopedic Surgery | Admitting: Orthopedic Surgery

## 2018-12-21 ENCOUNTER — Other Ambulatory Visit
Admission: RE | Admit: 2018-12-21 | Discharge: 2018-12-21 | Disposition: A | Discharge status: Home / Self Care | Payer: BLUE CROSS/BLUE SHIELD | Source: Ambulatory Visit | Attending: Orthopedic Surgery | Admitting: Orthopedic Surgery

## 2018-12-21 DIAGNOSIS — Z20828 Contact with and (suspected) exposure to other viral communicable diseases: Secondary | ICD-10-CM | POA: Diagnosis not present

## 2018-12-21 DIAGNOSIS — Z01812 Encounter for preprocedural laboratory examination: Secondary | ICD-10-CM | POA: Diagnosis not present

## 2018-12-21 LAB — SARS CORONAVIRUS 2 (TAT 6-24 HRS): SARS Coronavirus 2: NEGATIVE

## 2018-12-21 NOTE — Patient Instructions (Addendum)
Your procedure is scheduled on:  Report to DAY SURGERY DEPARTMENT LOCATED ON 2ND FLOOR MEDICAL MALL ENTRANCE. To find out your arrival time please call [phone] between 1PM - 3PM on .  Remember: Instructions that are not followed completely may result in serious medical risk, up to and including death, or upon the discretion of your surgeon and anesthesiologist your surgery may need to be rescheduled.     _X__ 1. Do not eat food after midnight the night before your procedure.                 No gum chewing or hard candies. You may drink clear liquids up to 2 hours                 before you are scheduled to arrive for your surgery- DO not drink clear                 liquids within 2 hours of the start of your surgery.                 Clear Liquids include:  water, apple juice without pulp, clear carbohydrate                 drink such as Clearfast or Gatorade, Black Coffee or Tea (Do not add                 anything to coffee or tea). Diabetics water only  __X__2.  On the morning of surgery brush your teeth with toothpaste and water, you                 may rinse your mouth with mouthwash if you wish.  Do not swallow any              toothpaste of mouthwash.     _X__ 3.  No Alcohol for 24 hours before or after surgery.   _X__ 4.  Do Not Smoke or use e-cigarettes For 24 Hours Prior to Your Surgery.                 Do not use any chewable tobacco products for at least 6 hours prior to                 surgery.  ____  5.  Bring all medications with you on the day of surgery if instructed.   __X__  6.  Notify your doctor if there is any change in your medical condition      (cold, fever, infections).     Do not wear jewelry, make-up, hairpins, clips or nail polish. Do not wear lotions, powders, or perfumes.  Do not shave 48 hours prior to surgery. Men may shave face and neck. Do not bring valuables to the hospital.    [facility] is not responsible for any belongings or  valuables.  Contacts, dentures/partials or body piercings may not be worn into surgery. Bring a case for your contacts, glasses or hearing aids, a denture cup will be supplied. Leave your suitcase in the car. After surgery it may be brought to your room. For patients admitted to the hospital, discharge time is determined by your treatment team.   Patients discharged the day of surgery will not be allowed to drive home.   Please read over the following fact sheets that you were given:   MRSA Information  __X__ Take these medicines the morning of surgery with A SIP OF WATER:      1. diazepam (VALIUM only if needed  2. oxyCODONE-acetaminophen (PERCOCET only if needed  3.   4.  5.  6.  ____ Fleet Enema (as directed)   ____ Use CHG Soap/SAGE wipes as directed  ____ Use inhalers on the day of surgery  ____ Stop metformin/Janumet/Farxiga 2 days prior to surgery    ____ Take 1/2 of usual insulin dose the night before surgery. No insulin the morning          of surgery.   ____ Stop Blood Thinners Coumadin/Plavix/Xarelto/Pleta/Pradaxa/Eliquis/Effient/Aspirin  on   Or contact your Surgeon, Cardiologist or Medical Doctor regarding  ability to stop your blood thinners  __X__ Stop Anti-inflammatories 7 days before surgery such as Advil, Ibuprofen, Motrin,  BC or Goodies Powder, Naprosyn, Naproxen, Aleve, Aspirin    __X__ Stop all herbal supplements, fish oil or vitamin E until after surgery.    ____ Bring C-Pap to the hospital.      Telephone interview, verbal instructions provided. Patient verbalized understanding.

## 2018-12-24 ENCOUNTER — Other Ambulatory Visit: Payer: Self-pay

## 2018-12-24 ENCOUNTER — Ambulatory Visit: Payer: BLUE CROSS/BLUE SHIELD | Admitting: Certified Registered"

## 2018-12-24 ENCOUNTER — Encounter
Admission: RE | Disposition: A | Discharge status: Home / Self Care | Payer: Self-pay | Source: Home / Self Care | Attending: Orthopedic Surgery

## 2018-12-24 ENCOUNTER — Ambulatory Visit
Admission: RE | Admit: 2018-12-24 | Discharge: 2018-12-24 | Disposition: A | Discharge status: Home / Self Care | Payer: BLUE CROSS/BLUE SHIELD | Attending: Orthopedic Surgery | Admitting: Orthopedic Surgery

## 2018-12-24 DIAGNOSIS — Z7951 Long term (current) use of inhaled steroids: Secondary | ICD-10-CM | POA: Diagnosis not present

## 2018-12-24 DIAGNOSIS — Z833 Family history of diabetes mellitus: Secondary | ICD-10-CM | POA: Diagnosis not present

## 2018-12-24 DIAGNOSIS — S83282A Other tear of lateral meniscus, current injury, left knee, initial encounter: Secondary | ICD-10-CM | POA: Insufficient documentation

## 2018-12-24 DIAGNOSIS — Z87891 Personal history of nicotine dependence: Secondary | ICD-10-CM | POA: Diagnosis not present

## 2018-12-24 DIAGNOSIS — Z825 Family history of asthma and other chronic lower respiratory diseases: Secondary | ICD-10-CM | POA: Diagnosis not present

## 2018-12-24 DIAGNOSIS — Z841 Family history of disorders of kidney and ureter: Secondary | ICD-10-CM | POA: Insufficient documentation

## 2018-12-24 DIAGNOSIS — Z79899 Other long term (current) drug therapy: Secondary | ICD-10-CM | POA: Diagnosis not present

## 2018-12-24 DIAGNOSIS — M23022 Cystic meniscus, posterior horn of medial meniscus, left knee: Secondary | ICD-10-CM | POA: Diagnosis not present

## 2018-12-24 DIAGNOSIS — M1712 Unilateral primary osteoarthritis, left knee: Secondary | ICD-10-CM | POA: Insufficient documentation

## 2018-12-24 DIAGNOSIS — M109 Gout, unspecified: Secondary | ICD-10-CM | POA: Diagnosis not present

## 2018-12-24 DIAGNOSIS — M659 Synovitis and tenosynovitis, unspecified: Secondary | ICD-10-CM | POA: Insufficient documentation

## 2018-12-24 DIAGNOSIS — Z91013 Allergy to seafood: Secondary | ICD-10-CM | POA: Diagnosis not present

## 2018-12-24 DIAGNOSIS — Z6841 Body Mass Index (BMI) 40.0 and over, adult: Secondary | ICD-10-CM | POA: Insufficient documentation

## 2018-12-24 DIAGNOSIS — Z888 Allergy status to other drugs, medicaments and biological substances status: Secondary | ICD-10-CM | POA: Insufficient documentation

## 2018-12-24 DIAGNOSIS — J45909 Unspecified asthma, uncomplicated: Secondary | ICD-10-CM | POA: Diagnosis not present

## 2018-12-24 HISTORY — PX: KNEE ARTHROSCOPY: SHX127

## 2018-12-24 LAB — GLUCOSE, CAPILLARY
Glucose-Capillary: 106 mg/dL — ABNORMAL HIGH (ref 70–99)
Glucose-Capillary: 107 mg/dL — ABNORMAL HIGH (ref 70–99)

## 2018-12-24 LAB — SYNOVIAL CELL COUNT + DIFF, W/ CRYSTALS
Crystals, Fluid: NONE SEEN
Eosinophils-Synovial: 0 %
Lymphocytes-Synovial Fld: 30 %
Monocyte-Macrophage-Synovial Fluid: 7 %
Neutrophil, Synovial: 62 %
Other Cells-SYN: 1
WBC, Synovial: 219 /mm3 — ABNORMAL HIGH (ref 0–200)

## 2018-12-24 SURGERY — ARTHROSCOPY, KNEE
Anesthesia: General | Laterality: Left

## 2018-12-24 MED ORDER — TRANEXAMIC ACID 1000 MG/10ML IV SOLN
INTRAVENOUS | Status: AC
  Filled 2018-12-24: qty 10

## 2018-12-24 MED ORDER — LIDOCAINE-EPINEPHRINE 1 %-1:100000 IJ SOLN
INTRAMUSCULAR | Status: AC
  Filled 2018-12-24: qty 1

## 2018-12-24 MED ORDER — FAMOTIDINE 20 MG PO TABS
ORAL_TABLET | ORAL | Status: AC
  Filled 2018-12-24: qty 1

## 2018-12-24 MED ORDER — ONDANSETRON 4 MG PO TBDP: 4.0000 mg | ORAL_TABLET | Freq: Three times a day (TID) | ORAL | 0 refills | Status: DC | PRN

## 2018-12-24 MED ORDER — SUGAMMADEX SODIUM 200 MG/2ML IV SOLN
INTRAVENOUS | Status: DC | PRN
  Administered 2018-12-24: 200 mg via INTRAVENOUS

## 2018-12-24 MED ORDER — BUPIVACAINE HCL (PF) 0.5 % IJ SOLN
INTRAMUSCULAR | Status: DC | PRN
  Administered 2018-12-24: 10 mL

## 2018-12-24 MED ORDER — DEXTROSE 5 % IV SOLN
3.0000 g | Freq: Once | INTRAVENOUS | Status: AC
  Administered 2018-12-24: 12:00:00 3 g via INTRAVENOUS
  Filled 2018-12-24: qty 3

## 2018-12-24 MED ORDER — MIDAZOLAM HCL 2 MG/2ML IJ SOLN
INTRAMUSCULAR | Status: AC
  Filled 2018-12-24: qty 2

## 2018-12-24 MED ORDER — PROMETHAZINE HCL 25 MG/ML IJ SOLN
INTRAMUSCULAR | Status: AC
  Administered 2018-12-24: 15:00:00 6.25 mg via INTRAVENOUS
  Filled 2018-12-24: qty 1

## 2018-12-24 MED ORDER — FENTANYL CITRATE (PF) 250 MCG/5ML IJ SOLN
INTRAMUSCULAR | Status: AC
  Filled 2018-12-24: qty 5

## 2018-12-24 MED ORDER — MIDAZOLAM HCL 2 MG/2ML IJ SOLN
INTRAMUSCULAR | Status: DC | PRN
  Administered 2018-12-24: 2 mg via INTRAVENOUS

## 2018-12-24 MED ORDER — SODIUM CHLORIDE FLUSH 0.9 % IV SOLN
INTRAVENOUS | Status: AC
  Filled 2018-12-24: qty 10

## 2018-12-24 MED ORDER — LACTATED RINGERS IV SOLN
INTRAVENOUS | Status: DC
  Administered 2018-12-24: 10:00:00 via INTRAVENOUS

## 2018-12-24 MED ORDER — ASPIRIN EC 325 MG PO TBEC: 325.0000 mg | DELAYED_RELEASE_TABLET | Freq: Every day | ORAL | 0 refills | Status: AC

## 2018-12-24 MED ORDER — PROPOFOL 10 MG/ML IV BOLUS
INTRAVENOUS | Status: AC
  Filled 2018-12-24: qty 20

## 2018-12-24 MED ORDER — IBUPROFEN 800 MG PO TABS: 800.0000 mg | ORAL_TABLET | Freq: Three times a day (TID) | ORAL | 1 refills | Status: DC

## 2018-12-24 MED ORDER — FENTANYL CITRATE (PF) 100 MCG/2ML IJ SOLN
INTRAMUSCULAR | Status: DC | PRN
  Administered 2018-12-24: 50 ug via INTRAVENOUS
  Administered 2018-12-24: 200 ug via INTRAVENOUS

## 2018-12-24 MED ORDER — PHENYLEPHRINE HCL (PRESSORS) 10 MG/ML IV SOLN
INTRAVENOUS | Status: DC | PRN
  Administered 2018-12-24: 200 ug via INTRAVENOUS

## 2018-12-24 MED ORDER — HYDROMORPHONE HCL 1 MG/ML IJ SOLN: 0.2500 mg | INTRAMUSCULAR | Status: DC | PRN

## 2018-12-24 MED ORDER — ACETAMINOPHEN 500 MG PO TABS: 1000.0000 mg | ORAL_TABLET | Freq: Three times a day (TID) | ORAL | 2 refills | Status: AC

## 2018-12-24 MED ORDER — BUPIVACAINE LIPOSOME 1.3 % IJ SUSP
INTRAMUSCULAR | Status: AC
  Filled 2018-12-24: qty 20

## 2018-12-24 MED ORDER — SUCCINYLCHOLINE CHLORIDE 20 MG/ML IJ SOLN
INTRAMUSCULAR | Status: DC | PRN
  Administered 2018-12-24: 100 mg via INTRAVENOUS

## 2018-12-24 MED ORDER — HYDROCODONE-ACETAMINOPHEN 5-325 MG PO TABS
ORAL_TABLET | ORAL | Status: AC
  Filled 2018-12-24: qty 2

## 2018-12-24 MED ORDER — ROCURONIUM BROMIDE 100 MG/10ML IV SOLN
INTRAVENOUS | Status: DC | PRN
  Administered 2018-12-24: 20 mg via INTRAVENOUS

## 2018-12-24 MED ORDER — BUPIVACAINE HCL (PF) 0.5 % IJ SOLN
INTRAMUSCULAR | Status: AC
  Filled 2018-12-24: qty 30

## 2018-12-24 MED ORDER — HYDROCODONE-ACETAMINOPHEN 5-325 MG PO TABS: 1.0000 | ORAL_TABLET | ORAL | 0 refills | Status: DC | PRN

## 2018-12-24 MED ORDER — DEXAMETHASONE SODIUM PHOSPHATE 10 MG/ML IJ SOLN
INTRAMUSCULAR | Status: DC | PRN
  Administered 2018-12-24 (×2): 10 mg via INTRAVENOUS

## 2018-12-24 MED ORDER — LIDOCAINE-EPINEPHRINE 1 %-1:100000 IJ SOLN
INTRAMUSCULAR | Status: DC | PRN
  Administered 2018-12-24: 10 mL

## 2018-12-24 MED ORDER — IBUPROFEN 800 MG PO TABS: 800.0000 mg | ORAL_TABLET | Freq: Three times a day (TID) | ORAL | 1 refills | Status: AC

## 2018-12-24 MED ORDER — ONDANSETRON HCL 4 MG/2ML IJ SOLN
INTRAMUSCULAR | Status: DC | PRN
  Administered 2018-12-24: 4 mg via INTRAVENOUS

## 2018-12-24 MED ORDER — PROMETHAZINE HCL 25 MG/ML IJ SOLN
6.2500 mg | Freq: Once | INTRAMUSCULAR | Status: AC
  Administered 2018-12-24: 15:00:00 6.25 mg via INTRAVENOUS

## 2018-12-24 MED ORDER — FAMOTIDINE 20 MG PO TABS
20.0000 mg | ORAL_TABLET | Freq: Once | ORAL | Status: AC
  Administered 2018-12-24: 20 mg via ORAL

## 2018-12-24 MED ORDER — LACTATED RINGERS IR SOLN
Status: DC | PRN
  Administered 2018-12-24: 3000 mL

## 2018-12-24 MED ORDER — LIDOCAINE HCL (CARDIAC) PF 100 MG/5ML IV SOSY
PREFILLED_SYRINGE | INTRAVENOUS | Status: DC | PRN
  Administered 2018-12-24: 100 mg via INTRAVENOUS

## 2018-12-24 MED ORDER — HYDROCODONE-ACETAMINOPHEN 5-325 MG PO TABS
1.0000 | ORAL_TABLET | Freq: Once | ORAL | Status: AC
  Administered 2018-12-24: 15:00:00 2 via ORAL

## 2018-12-24 MED ORDER — PROPOFOL 10 MG/ML IV BOLUS
INTRAVENOUS | Status: DC | PRN
  Administered 2018-12-24: 200 mg via INTRAVENOUS

## 2018-12-24 SURGICAL SUPPLY — 49 items
ADAPTER IRRIG TUBE 2 SPIKE SOL (ADAPTER) ×4 IMPLANT
BLADE SURG SZ11 CARB STEEL (BLADE) ×2 IMPLANT
BNDG ESMARK 6X12 TAN STRL LF (GAUZE/BANDAGES/DRESSINGS) IMPLANT
BRUSH SCRUB EZ  4% CHG (MISCELLANEOUS) ×1
BRUSH SCRUB EZ 4% CHG (MISCELLANEOUS) ×1 IMPLANT
BUR RADIUS 3.5 (BURR) ×2 IMPLANT
BUR RADIUS 4.0X18.5 (BURR) ×2 IMPLANT
CHLORAPREP W/TINT 26 (MISCELLANEOUS) ×2 IMPLANT
COOLER POLAR GLACIER W/PUMP (MISCELLANEOUS) ×2 IMPLANT
COVER WAND RF STERILE (DRAPES) ×2 IMPLANT
CUFF TOURN SGL QUICK 24 (TOURNIQUET CUFF)
CUFF TOURN SGL QUICK 30 (TOURNIQUET CUFF)
CUFF TRNQT CYL 24X4X16.5-23 (TOURNIQUET CUFF) IMPLANT
CUFF TRNQT CYL 30X4X21-28X (TOURNIQUET CUFF) IMPLANT
DRAPE SPLIT 6X30 W/TAPE (DRAPES) ×2 IMPLANT
GAUZE SPONGE 4X4 12PLY STRL (GAUZE/BANDAGES/DRESSINGS) ×2 IMPLANT
GLOVE BIOGEL PI IND STRL 8 (GLOVE) ×1 IMPLANT
GLOVE BIOGEL PI INDICATOR 8 (GLOVE) ×1
GLOVE SURG SYN 7.5  E (GLOVE) ×1
GLOVE SURG SYN 7.5 E (GLOVE) ×1 IMPLANT
GOWN STRL REUS W/ TWL LRG LVL3 (GOWN DISPOSABLE) ×1 IMPLANT
GOWN STRL REUS W/TWL LRG LVL3 (GOWN DISPOSABLE) ×1
GOWN STRL REUS W/TWL LRG LVL4 (GOWN DISPOSABLE) ×2 IMPLANT
IV LACTATED RINGER IRRG 3000ML (IV SOLUTION) ×9
IV LR IRRIG 3000ML ARTHROMATIC (IV SOLUTION) ×9 IMPLANT
KIT TURNOVER KIT A (KITS) ×2 IMPLANT
MANIFOLD NEPTUNE II (INSTRUMENTS) ×2 IMPLANT
MAT ABSORB  FLUID 56X50 GRAY (MISCELLANEOUS) ×1
MAT ABSORB FLUID 56X50 GRAY (MISCELLANEOUS) ×1 IMPLANT
NEEDLE HYPO 22GX1.5 SAFETY (NEEDLE) ×2 IMPLANT
PACK ARTHROSCOPY KNEE (MISCELLANEOUS) ×2 IMPLANT
PAD ABD DERMACEA PRESS 5X9 (GAUZE/BANDAGES/DRESSINGS) ×4 IMPLANT
PAD WRAPON POLAR KNEE (MISCELLANEOUS) ×1 IMPLANT
PENCIL SMOKE ULTRAEVAC 22 CON (MISCELLANEOUS) ×2 IMPLANT
SET TUBE SUCT SHAVER OUTFL 24K (TUBING) ×2 IMPLANT
SET TUBE TIP INTRA-ARTICULAR (MISCELLANEOUS) ×2 IMPLANT
STRIP CLOSURE SKIN 1/2X4 (GAUZE/BANDAGES/DRESSINGS) ×2 IMPLANT
SUT ETHILON 4-0 (SUTURE) ×1
SUT ETHILON 4-0 FS2 18XMFL BLK (SUTURE) ×1
SUT MNCRL 4-0 (SUTURE) ×1
SUT MNCRL 4-0 27XMFL (SUTURE) ×1
SUT VIC AB 0 CT1 36 (SUTURE) ×2 IMPLANT
SUT VIC AB 2-0 CT2 27 (SUTURE) ×2 IMPLANT
SUTURE ETHLN 4-0 FS2 18XMF BLK (SUTURE) ×1 IMPLANT
SUTURE MNCRL 4-0 27XMF (SUTURE) ×1 IMPLANT
TUBING ARTHRO INFLOW-ONLY STRL (TUBING) ×2 IMPLANT
WAND HAND CNTRL MULTIVAC 50 (MISCELLANEOUS) IMPLANT
WAND WEREWOLF FLOW 90D (MISCELLANEOUS) IMPLANT
WRAPON POLAR PAD KNEE (MISCELLANEOUS) ×2

## 2018-12-24 NOTE — Discharge Instructions (Signed)
Arthroscopic Knee Surgery   Post-Op Instructions   1. Bracing or crutches: Crutches will be provided at the time of discharge from the surgery center if you do not already have them.   2. Ice: You may be provided with a device (Polar Care) that allows you to ice the affected area effectively. Otherwise you can ice manually.    3. Driving:  Plan on not driving for at least one week. Please note that you are advised NOT to drive while taking narcotic pain medications as you may be impaired and unsafe to drive.   4. Activity: Ankle pumps several times an hour while awake to prevent blood clots. Weight bearing: as tolerated. Use crutches for as needed (usually ~1 week or less) until pain allows you to ambulate without a limp. Bending and straightening the knee is unlimited. Elevate knee above heart level as much as possible for one week. Avoid standing more than 5 minutes (consecutively) for the first week.  Avoid long distance travel for 2 weeks.   5. Medications:  - You have been provided a prescription for narcotic pain medicine. After surgery, take 1-2 narcotic tablets every 4 hours if needed for severe pain.  - You may take up to 3000mg/day of tylenol (acetaminophen). You can take 1000mg 3x/day. Please check your narcotic. If you have acetaminophen in your narcotic (each tablet will be 325mg), be careful not to exceed a total of 3000mg/day of acetaminophen.  - A prescription for anti-nausea medication will be provided in case the narcotic medicine causes nausea - take 1 tablet every 6 hours only if nauseated.  - Take ibuprofen 800 mg every 8 hours WITH food to reduce post-operative knee swelling. DO NOT STOP IBUPROFEN POST-OP UNTIL INSTRUCTED TO DO SO at first post-op office visit (10-14 days after surgery). However, please discontinue if you have any abdominal discomfort after taking this.  - Take enteric coated aspirin 325 mg once daily for 2 weeks to prevent blood clots.   6. Bandages: The  physical therapist should change the bandages at the first post-op appointment. If needed, the dressing supplies have been provided to you.   7. Physical Therapy: 1-2 times per week for 6 weeks. Therapy typically starts on post operative Day 3 or 4. You have been provided an order for physical therapy. The therapist will provide home exercises.   8. Work: May return to full work usually around 2 weeks after 1st post-operative visit. May do light duty/desk job in approximately 1-2 weeks when off of narcotics, pain is well-controlled, and swelling has decreased. Labor intensive jobs may require 4-6 weeks to return.      9. Post-Op Appointments: Your first post-op appointment will be with Dr. Patel in approximately 2 weeks time.    If you find that they have not been scheduled please call the Orthopaedic Appointment front desk at [phone].   AMBULATORY SURGERY  DISCHARGE INSTRUCTIONS   1) The drugs that you were given will stay in your system until tomorrow so for the next 24 hours you should not:  A) Drive an automobile B) Make any legal decisions C) Drink any alcoholic beverage   2) You may resume regular meals tomorrow.  Today it is better to start with liquids and gradually work up to solid foods.  You may eat anything you prefer, but it is better to start with liquids, then soup and crackers, and gradually work up to solid foods.   3) Please notify your doctor immediately if you   any unusual bleeding, trouble breathing, redness and pain at the surgery site, drainage, fever, or pain not relieved by medication.    4) Additional Instructions:        Please contact your physician with any problems or Same Day Surgery at [phone], Monday through Friday 6 am to 4 pm, or [facility] number at [phone].

## 2018-12-24 NOTE — H&P (Signed)
Paper H&P to be scanned into permanent record. H&P reviewed. No significant changes noted.  

## 2018-12-24 NOTE — Anesthesia Preprocedure Evaluation (Addendum)
Anesthesia Evaluation  Patient identified by MRN, date of birth, ID band Patient awake    Reviewed: Allergy & Precautions, H&P , NPO status , Patient's Chart, lab work & pertinent test results  Airway Mallampati: II  TM Distance: >3 FB Neck ROM: full    Dental  (+) Teeth Intact   Pulmonary asthma , former smoker,           Cardiovascular negative cardio ROS       Neuro/Psych negative neurological ROS  negative psych ROS   GI/Hepatic negative GI ROS, Neg liver ROS,   Endo/Other  Morbid obesity (BMI 41)  Renal/GU      Musculoskeletal   Abdominal   Peds  Hematology negative hematology ROS (+)   Anesthesia Other Findings Past Medical History: No date: Asthma No date: Gout  Past Surgical History: No date: bilateral leg surgery No date: FRACTURE SURGERY; Bilateral     Comment:  lower extremities  BMI    Body Mass Index: 41.06 kg/m      Reproductive/Obstetrics negative OB ROS                             Anesthesia Physical Anesthesia Plan  ASA: II  Anesthesia Plan: General ETT   Post-op Pain Management:    Induction:   PONV Risk Score and Plan: Ondansetron, Dexamethasone, Midazolam and Treatment may vary due to age or medical condition  Airway Management Planned:   Additional Equipment:   Intra-op Plan:   Post-operative Plan:   Informed Consent: I have reviewed the patients History and Physical, chart, labs and discussed the procedure including the risks, benefits and alternatives for the proposed anesthesia with the patient or authorized representative who has indicated his/her understanding and acceptance.     Dental Advisory Given  Plan Discussed with: Anesthesiologist and CRNA  Anesthesia Plan Comments: (Consented for post op PRN block.  Discussed risks including infection, bleeding, and nerve injury.  Pt agrees and gives consent, will re-assess after surgery.   KLF)       Anesthesia Quick Evaluation

## 2018-12-24 NOTE — Anesthesia Post-op Follow-up Note (Signed)
Anesthesia QCDR form completed.        

## 2018-12-24 NOTE — Anesthesia Procedure Notes (Signed)
Procedure Name: Intubation Date/Time: 12/24/2018 11:48 AM Performed by: Philbert Riser, CRNA Pre-anesthesia Checklist: Patient identified, Emergency Drugs available, Suction available, Patient being monitored and Timeout performed Patient Re-evaluated:Patient Re-evaluated prior to induction Oxygen Delivery Method: Circle system utilized and Simple face mask Preoxygenation: Pre-oxygenation with 100% oxygen Induction Type: IV induction Ventilation: Mask ventilation without difficulty and Oral airway inserted - appropriate to patient size Laryngoscope Size: McGraph and 4 Grade View: Grade I Tube type: Oral Tube size: 7.5 mm Number of attempts: 1 Airway Equipment and Method: Stylet Placement Confirmation: ETT inserted through vocal cords under direct vision,  positive ETCO2 and breath sounds checked- equal and bilateral Secured at: 22 cm Tube secured with: Tape Dental Injury: Teeth and Oropharynx as per pre-operative assessment
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
## 2018-12-24 NOTE — Transfer of Care (Signed)
Immediate Anesthesia Transfer of Care Note  Patient: Jesus Singleton  Procedure(s) Performed: left knee arthroscopy,partial medial lateral menisectomy,chondroplastyt (Left )  Patient Location: PACU  Anesthesia Type:General  Level of Consciousness: sedated  Airway & Oxygen Therapy: Patient Spontanous Breathing and Patient connected to face mask oxygen  Post-op Assessment: Report given to RN and Post -op Vital signs reviewed and stable  Post vital signs: Reviewed and stable  Last Vitals:  Vitals Value Taken Time  BP 117/58 12/24/18 1352  Temp    Pulse 95 12/24/18 1353  Resp 31 12/24/18 1353  SpO2 96 % 12/24/18 1353  Vitals shown include unvalidated device data.  Last Pain:  Vitals:   12/24/18 1017  TempSrc: Oral  PainSc: 4          Complications: No apparent anesthesia complications

## 2018-12-25 NOTE — Anesthesia Postprocedure Evaluation (Signed)
Anesthesia Post Note  Patient: Jesus Singleton  Procedure(s) Performed: left knee arthroscopy,partial medial lateral menisectomy,chondroplastyt (Left )  Patient location during evaluation: PACU Anesthesia Type: General Level of consciousness: awake and alert Pain management: pain level controlled Vital Signs Assessment: post-procedure vital signs reviewed and stable Respiratory status: spontaneous breathing, nonlabored ventilation and respiratory function stable Cardiovascular status: blood pressure returned to baseline and stable Postop Assessment: no apparent nausea or vomiting (Nausea rxd) Anesthetic complications: no     Last Vitals:  Vitals:   12/24/18 1530 12/24/18 1634  BP: 115/67 (!) 143/76  Pulse: 89 89  Resp: 18 16  Temp: 36.8 C   SpO2: 98% 100%    Last Pain:  Vitals:   12/25/18 0828  TempSrc:   PainSc: 9                  Wren Gallaga Harvie Heck

## 2018-12-28 LAB — BODY FLUID CULTURE: Culture: NO GROWTH

## 2018-12-28 LAB — SURGICAL PATHOLOGY

## 2018-12-30 ENCOUNTER — Other Ambulatory Visit: Payer: Self-pay | Admitting: Neurology

## 2018-12-30 DIAGNOSIS — S069X9S Unspecified intracranial injury with loss of consciousness of unspecified duration, sequela: Secondary | ICD-10-CM

## 2019-01-06 ENCOUNTER — Ambulatory Visit
Admission: RE | Admit: 2019-01-06 | Discharge: 2019-01-06 | Disposition: A | Discharge status: Home / Self Care | Payer: BLUE CROSS/BLUE SHIELD | Source: Ambulatory Visit | Attending: Neurology | Admitting: Neurology

## 2019-01-06 DIAGNOSIS — S069X9S Unspecified intracranial injury with loss of consciousness of unspecified duration, sequela: Secondary | ICD-10-CM

## 2019-02-03 ENCOUNTER — Other Ambulatory Visit
Admission: RE | Admit: 2019-02-03 | Discharge: 2019-02-03 | Disposition: A | Discharge status: Home / Self Care | Payer: BLUE CROSS/BLUE SHIELD | Source: Ambulatory Visit | Attending: Sports Medicine | Admitting: Sports Medicine

## 2019-02-03 DIAGNOSIS — M25462 Effusion, left knee: Secondary | ICD-10-CM | POA: Insufficient documentation

## 2019-02-03 DIAGNOSIS — M25562 Pain in left knee: Secondary | ICD-10-CM | POA: Insufficient documentation

## 2019-02-03 LAB — SYNOVIAL CELL COUNT + DIFF, W/ CRYSTALS
Eosinophils-Synovial: 0 %
Lymphocytes-Synovial Fld: 33 %
Monocyte-Macrophage-Synovial Fluid: 42 %
Neutrophil, Synovial: 35 %
WBC, Synovial: 306 /mm3 — ABNORMAL HIGH (ref 0–200)

## 2019-04-05 IMAGING — DX DG KNEE COMPLETE 4+V*L*
4 series · 4 of 4 positions shown · non-contrast
Comparison: None.

CLINICAL DATA: Left knee pain, swelling.

EXAM:
LEFT KNEE - COMPLETE 4+ VIEW

[knee ap]
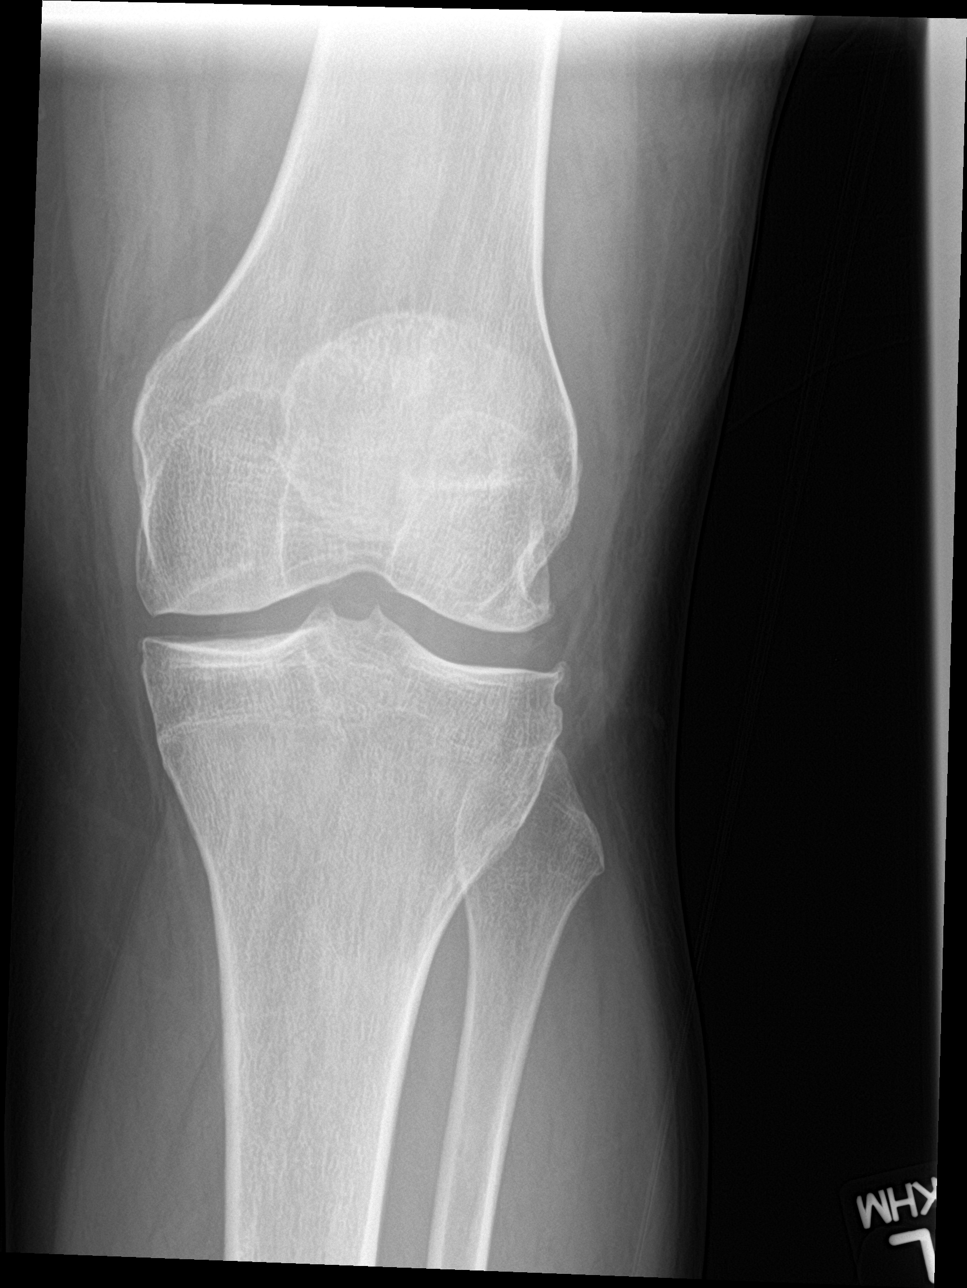

[tunnel]
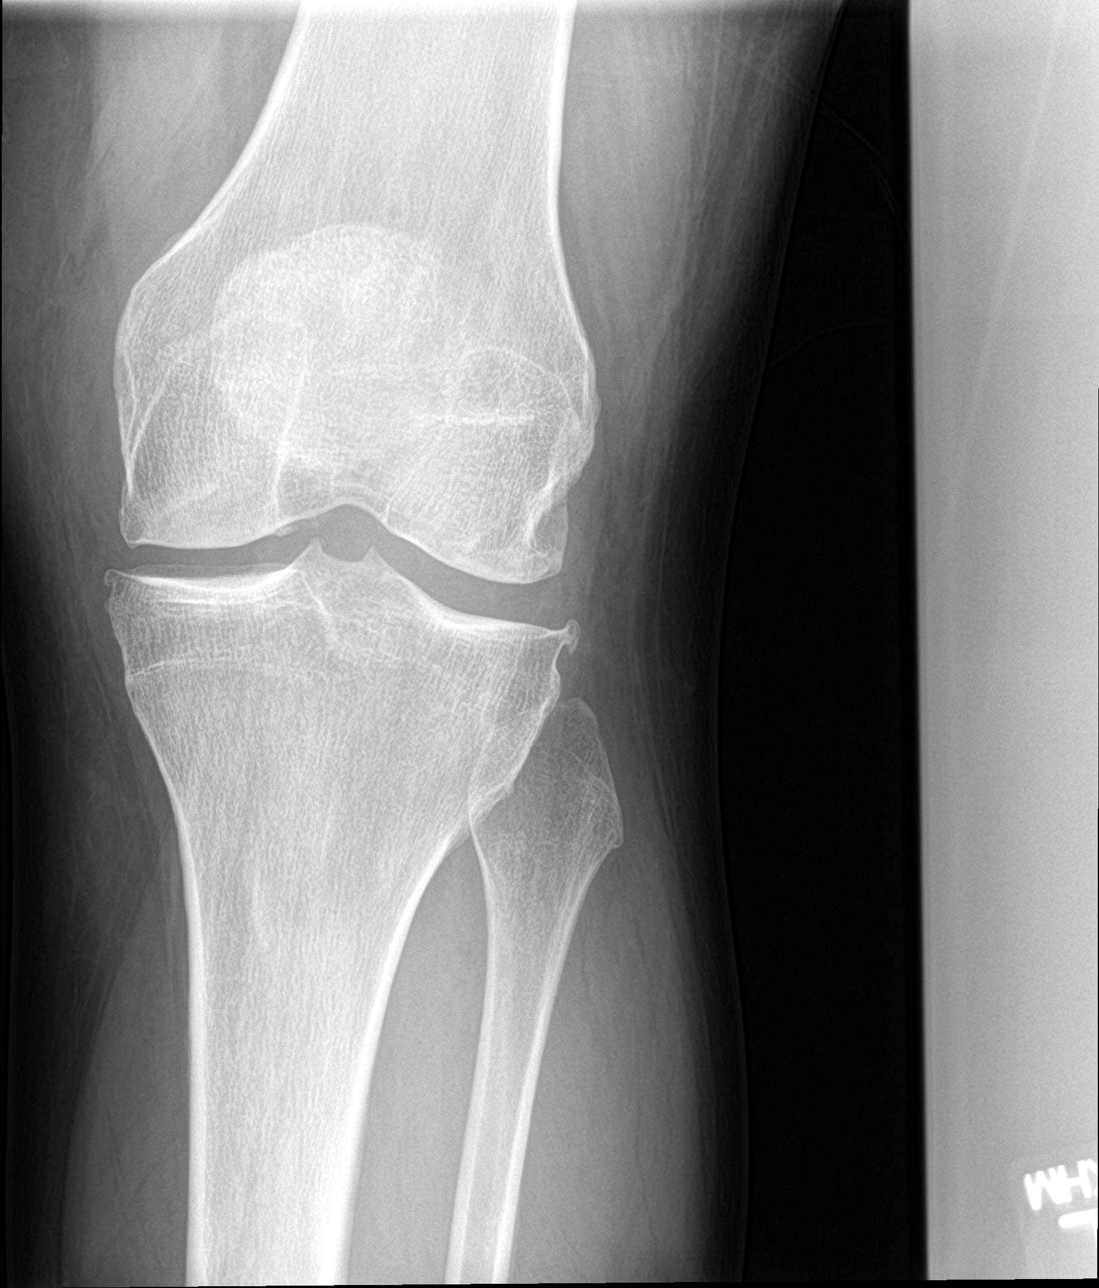

[knee lat]
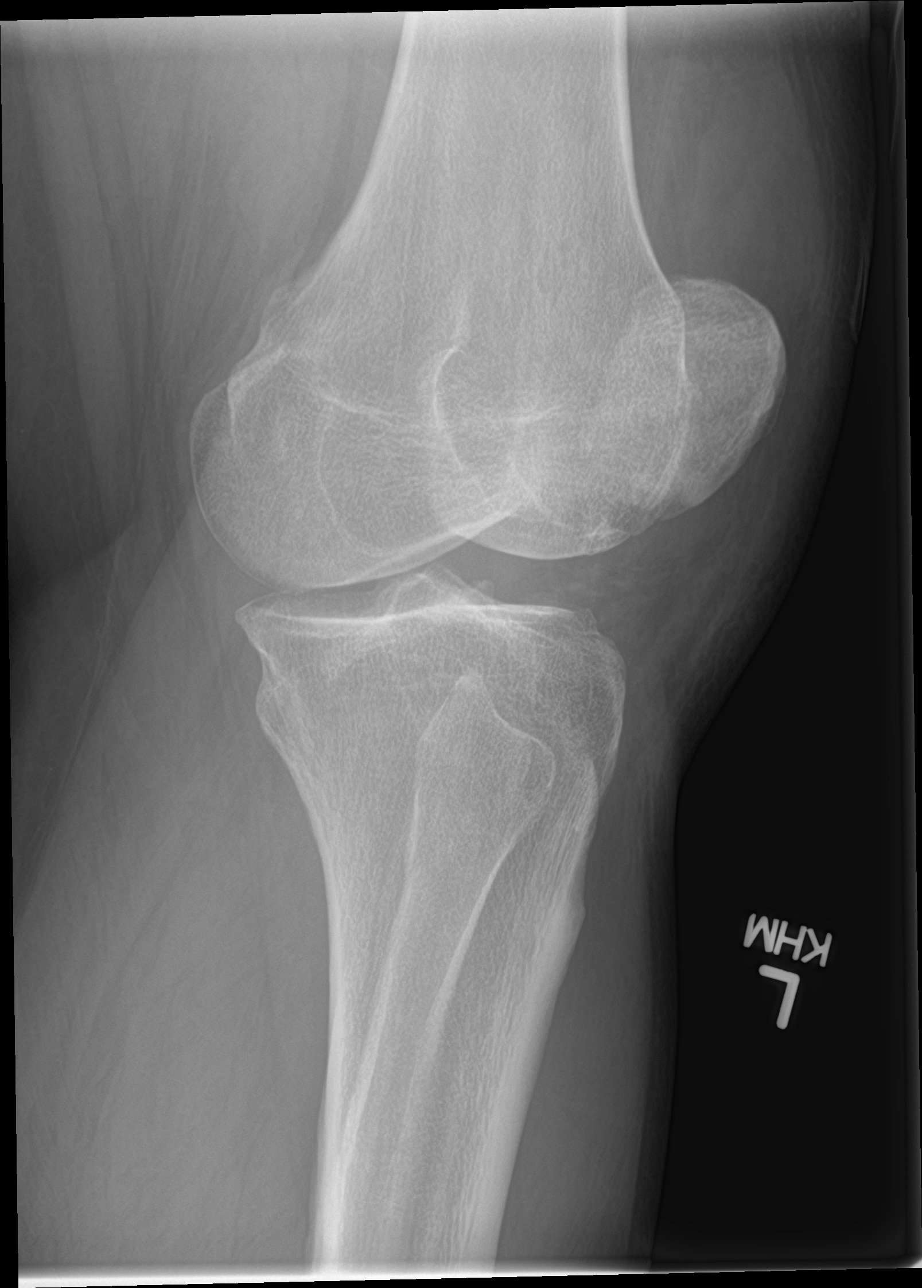

[knee sunrise]
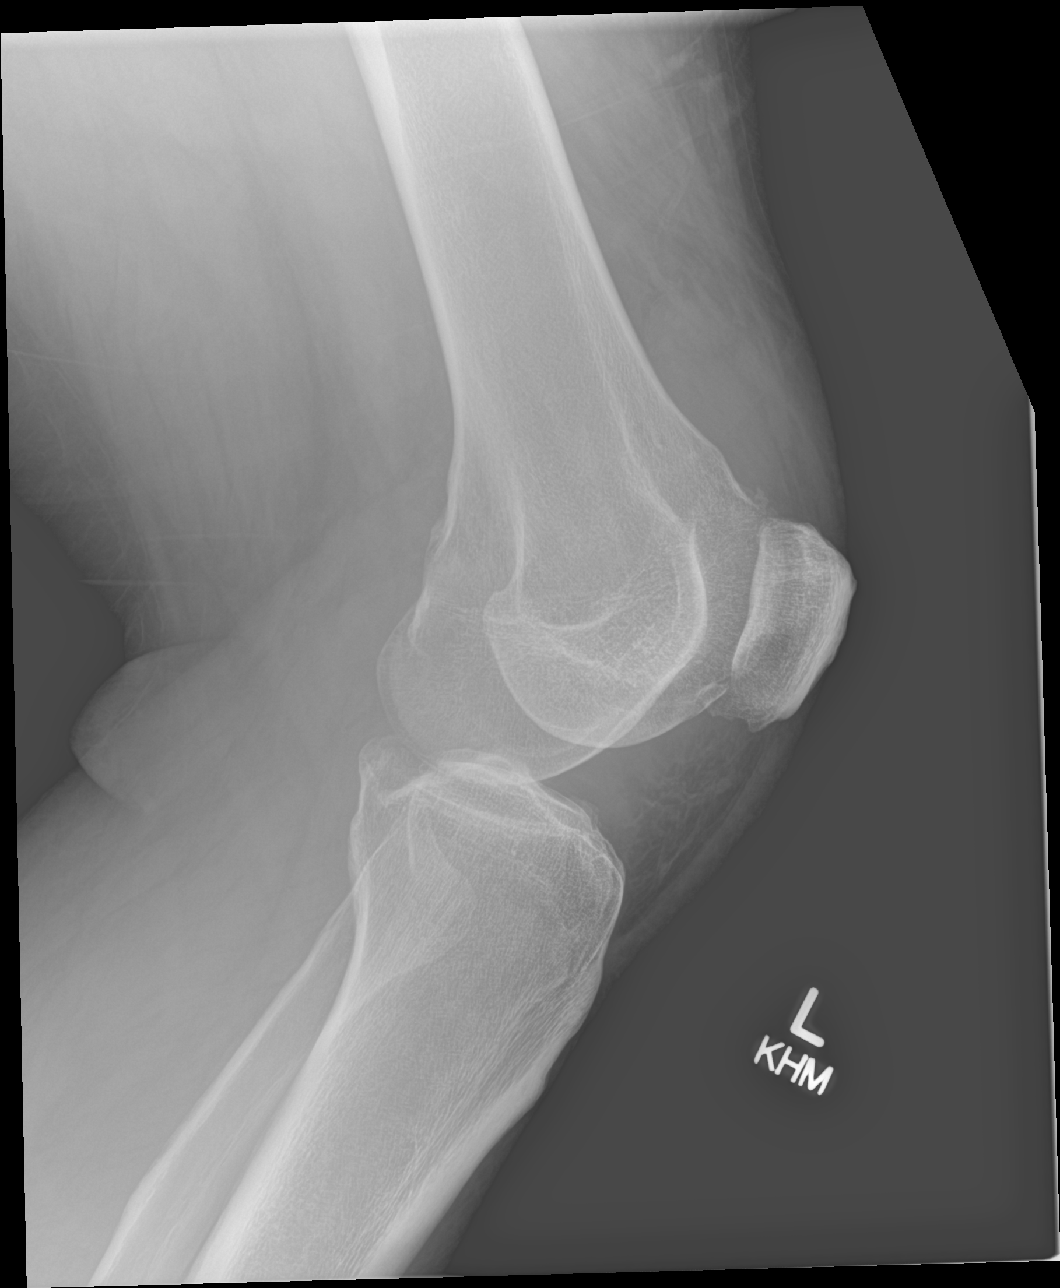

[4 of 4 positions shown; findings below may reference images not displayed]

FINDINGS: Spurring throughout the left knee. Moderate joint effusion. No acute
bony abnormality. Specifically, no fracture, subluxation, or
dislocation.
IMPRESSION: Mild degenerative changes and moderate joint effusion. No acute bony
abnormality.

## 2019-09-26 ENCOUNTER — Emergency Department
Admission: EM | Admit: 2019-09-26 | Discharge: 2019-09-26 | Disposition: A | Discharge status: Home / Self Care | Payer: BLUE CROSS/BLUE SHIELD | Attending: Emergency Medicine | Admitting: Emergency Medicine

## 2019-09-26 ENCOUNTER — Encounter: Payer: Self-pay | Admitting: Emergency Medicine

## 2019-09-26 ENCOUNTER — Emergency Department: Payer: BLUE CROSS/BLUE SHIELD

## 2019-09-26 ENCOUNTER — Other Ambulatory Visit: Payer: Self-pay

## 2019-09-26 DIAGNOSIS — R2242 Localized swelling, mass and lump, left lower limb: Secondary | ICD-10-CM | POA: Insufficient documentation

## 2019-09-26 DIAGNOSIS — Z87891 Personal history of nicotine dependence: Secondary | ICD-10-CM | POA: Diagnosis not present

## 2019-09-26 DIAGNOSIS — M25462 Effusion, left knee: Secondary | ICD-10-CM

## 2019-09-26 DIAGNOSIS — M25562 Pain in left knee: Secondary | ICD-10-CM | POA: Insufficient documentation

## 2019-09-26 DIAGNOSIS — M1712 Unilateral primary osteoarthritis, left knee: Secondary | ICD-10-CM | POA: Diagnosis not present

## 2019-09-26 DIAGNOSIS — J45909 Unspecified asthma, uncomplicated: Secondary | ICD-10-CM | POA: Diagnosis not present

## 2019-09-26 MED ORDER — HYDROCODONE-ACETAMINOPHEN 5-325 MG PO TABS
1.0000 | ORAL_TABLET | Freq: Once | ORAL | Status: AC
  Administered 2019-09-26: 1 via ORAL
  Filled 2019-09-26: qty 1

## 2019-09-26 MED ORDER — PREDNISONE 10 MG PO TABS: ORAL_TABLET | ORAL | 0 refills | Status: DC

## 2019-09-26 MED ORDER — PREDNISONE 20 MG PO TABS
60.0000 mg | ORAL_TABLET | Freq: Once | ORAL | Status: AC
  Administered 2019-09-26: 60 mg via ORAL
  Filled 2019-09-26: qty 3

## 2019-09-26 NOTE — ED Notes (Signed)
See triage note  Presents with left knee pain  States he has a hx of gout and feels the same  Denies any injury  Ambulates to room with slight limp

## 2019-09-26 NOTE — ED Provider Notes (Signed)
[facility] Emergency Department Provider Note ____________________________________________  Time seen: 0815  I have reviewed the triage vital signs and the nursing notes.  HISTORY  Chief Complaint  Knee Pain   HPI Jesus Singleton is a 34 y.o. male presents to the ER today with complaint of left knee pain and swelling.  He reports this started 2 days ago.  He describes the pain as sharp and stabbing.  The pain is worse with ambulation.  He denies numbness, tingling or weakness of his left lower extremity.  He denies any trauma to the area.  He reports he has a history of arthritis and gout.  He recently ran out of his Allopurinol and thinks this may be why his knee is hurting.  He has had a prior meniscal repair of this knee.  He has not taken anything OTC for this.  Past Medical History:  Diagnosis Date  . Asthma   . Gout     There are no problems to display for this patient.   Past Surgical History:  Procedure Laterality Date  . bilateral leg surgery    . FRACTURE SURGERY Bilateral    lower extremities  . KNEE ARTHROSCOPY Left 12/24/2018   Procedure: left knee arthroscopy,partial medial lateral menisectomy,chondroplastyt;  Surgeon: Signa Kell, MD;  Location: [facility] ORS;  Service: Orthopedics;  Laterality: Left;    Prior to Admission medications   Medication Sig Start Date End Date Taking? Authorizing Provider  acetaminophen (TYLENOL) 500 MG tablet Take 2 tablets (1,000 mg total) by mouth every 8 (eight) hours. 12/24/18 12/24/19  Signa Kell, MD  albuterol (VENTOLIN HFA) 108 (90 Base) MCG/ACT inhaler Inhale 2 puffs into the lungs every 6 (six) hours as needed for wheezing or shortness of breath. 11/14/18   Irean Hong, MD  cyclobenzaprine (FLEXERIL) 5 MG tablet Take 1-2 tablets 3 times daily as needed 10/25/18   Enid Derry, PA-C  predniSONE (DELTASONE) 10 MG tablet Take 6 tabs day 1, 5 tabs day 2, 4 tabs day 3, 3 tabs day 4, 2 tabs day 5, 1 tab day 6  09/26/19   Lorre Munroe, NP    Allergies Shellfish allergy and Tramadol  No family history on file.  Social History Social History   Tobacco Use  . Smoking status: Former Smoker    Packs/day: 0.20    Types: Cigarettes    Quit date: 07/14/2018    Years since quitting: 1.2  . Smokeless tobacco: Never Used  Substance Use Topics  . Alcohol use: No  . Drug use: No    Review of Systems  Constitutional: Negative for fever, chills or body aches. Cardiovascular: Negative for chest pain or chest tightness. Respiratory: Negative for cough or shortness of breath. Musculoskeletal: Positive for left knee pain and swelling.  Negative for back pain, left ankle pain. Skin: Negative for redness or warmth of the left knee. Neurological: Negative for focal weakness, tingling or numbness. ____________________________________________  PHYSICAL EXAM:  VITAL SIGNS: ED Triage Vitals  Enc Vitals Group     BP 09/26/19 0809 129/82     Pulse Rate 09/26/19 0809 (!) 111     Resp 09/26/19 0809 16     Temp 09/26/19 0809 98.4 F (36.9 C)     Temp Source 09/26/19 0809 Oral     SpO2 09/26/19 0809 97 %     Weight 09/26/19 0804 200 lb (90.7 kg)     Height 09/26/19 0804 6' (1.829 m)     Head  Circumference --      Peak Flow --      Pain Score 09/26/19 0804 7     Pain Loc --      Pain Edu? --      Excl. in [place]? --     Constitutional: Alert and oriented.  Obese but in no distress. Cardiovascular: Tachycardic, regular rhythm.  Pedal pulses 2+ bilaterally Respiratory: Normal respiratory effort. No wheezes/rales/rhonchi. Musculoskeletal: Normal flexion and extension of the left knee.  Crepitus noted with range of motion of the left knee.  Joint enlargement with peripatellar swelling noted.  Pain with palpation over bilateral pes bursa.  Gait slow and steady without device. Neurologic:  Normal gait without ataxia. Normal speech and language. No gross focal neurologic deficits are appreciated. Skin:   Skin is warm, dry and intact. No redness or warmth noted. ____________________________________________   RADIOLOGY  Imaging Orders     DG Knee Complete 4 Views Left IMPRESSION:  1. Moderate tricompartment osteoarthritis.  2. Tiny suprapatellar joint effusion.    ____________________________________________    INITIAL IMPRESSION / ASSESSMENT AND PLAN / ED COURSE  Left Knee Pain and Swelling, OA Left Knee:  Does not appear to be gout related Prednisone 60 mg PO x 1 Norco 5-325 mg PO x 1 Xray left knee c/w arthritis RX for Pred Taper x 6 days Encouraged Neoprene compression sleeve for the knee Ice and elevate to help reduce swelling     I reviewed the patient's prescription history over the last 12 months in the multi-state controlled substances database(s) that includes [place], [place], [place], [place], [place], [place], [place], [place], [place], [place], [place], [place], [place], and [place].  Results were notable for Oxycodone 5 mg 05/05/19 #30, 02/04/19 #20, 01/12/19 #15, 12/25/18 #20, Norco 5-325 12/24/18 #10, Percocet 5-325 12/17/18 #20, 10/30/18 #14, 10/29/18 #6. ____________________________________________  FINAL CLINICAL IMPRESSION(S) / ED DIAGNOSES  Final diagnoses:  Acute pain of left knee  Pain and swelling of left knee  Osteoarthritis of left knee, unspecified osteoarthritis type      Jearld Fenton, NP 09/26/19 4158    Vanessa Charlotte, MD 09/26/19 1057

## 2019-09-26 NOTE — Discharge Instructions (Addendum)
You were seen today for left knee pain and swelling.  Your x-ray is consistent with arthritis.  I have given you a prescription for steroids to take as prescribed for the next 6 days.  Ice, elevation and compression with a neoprene knee sleeve will help to reduce pain and swelling.  Please follow-up with your PCP if symptoms persist or worsen

## 2019-09-26 NOTE — ED Triage Notes (Signed)
Pt to Ed via POV c/o left knee pain. Denies injury, states has hx/o arthritis. Pt is in NAD.

## 2019-11-30 ENCOUNTER — Encounter (HOSPITAL_COMMUNITY): Payer: Self-pay

## 2019-11-30 ENCOUNTER — Emergency Department (HOSPITAL_COMMUNITY)
Admission: EM | Admit: 2019-11-30 | Discharge: 2019-11-30 | Disposition: A | Discharge status: Home / Self Care | Payer: BLUE CROSS/BLUE SHIELD | Attending: Emergency Medicine | Admitting: Emergency Medicine

## 2019-11-30 ENCOUNTER — Emergency Department (HOSPITAL_COMMUNITY): Payer: BLUE CROSS/BLUE SHIELD

## 2019-11-30 ENCOUNTER — Other Ambulatory Visit: Payer: Self-pay

## 2019-11-30 DIAGNOSIS — S161XXA Strain of muscle, fascia and tendon at neck level, initial encounter: Secondary | ICD-10-CM | POA: Insufficient documentation

## 2019-11-30 DIAGNOSIS — S8992XA Unspecified injury of left lower leg, initial encounter: Secondary | ICD-10-CM

## 2019-11-30 DIAGNOSIS — Y9389 Activity, other specified: Secondary | ICD-10-CM | POA: Insufficient documentation

## 2019-11-30 DIAGNOSIS — S80912A Unspecified superficial injury of left knee, initial encounter: Secondary | ICD-10-CM | POA: Diagnosis not present

## 2019-11-30 DIAGNOSIS — Z7951 Long term (current) use of inhaled steroids: Secondary | ICD-10-CM | POA: Diagnosis not present

## 2019-11-30 DIAGNOSIS — J45909 Unspecified asthma, uncomplicated: Secondary | ICD-10-CM | POA: Insufficient documentation

## 2019-11-30 DIAGNOSIS — S39012A Strain of muscle, fascia and tendon of lower back, initial encounter: Secondary | ICD-10-CM | POA: Diagnosis not present

## 2019-11-30 DIAGNOSIS — S199XXA Unspecified injury of neck, initial encounter: Secondary | ICD-10-CM | POA: Diagnosis present

## 2019-11-30 DIAGNOSIS — Y999 Unspecified external cause status: Secondary | ICD-10-CM | POA: Insufficient documentation

## 2019-11-30 DIAGNOSIS — F17210 Nicotine dependence, cigarettes, uncomplicated: Secondary | ICD-10-CM | POA: Insufficient documentation

## 2019-11-30 DIAGNOSIS — Y92410 Unspecified street and highway as the place of occurrence of the external cause: Secondary | ICD-10-CM | POA: Insufficient documentation

## 2019-11-30 MED ORDER — METHOCARBAMOL 500 MG PO TABS: 500.0000 mg | ORAL_TABLET | Freq: Three times a day (TID) | ORAL | 0 refills | Status: DC

## 2019-11-30 MED ORDER — IBUPROFEN 800 MG PO TABS: 800.0000 mg | ORAL_TABLET | Freq: Three times a day (TID) | ORAL | 0 refills | Status: AC
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
## 2019-11-30 NOTE — Discharge Instructions (Addendum)
Elevate and apply ice packs on and off to your knee.  Wear the knee brace as needed for walking or standing.  You may remove for bathing and at bedtime.  Follow-up with your orthopedic provider for recheck.
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
## 2020-06-27 IMAGING — DX DG KNEE COMPLETE 4+V*L*
4 series · 4 of 4 positions shown · non-contrast
Comparison: None.

CLINICAL DATA: Left knee pain and swelling.

EXAM:
LEFT KNEE - COMPLETE 4+ VIEW

[knee ap]
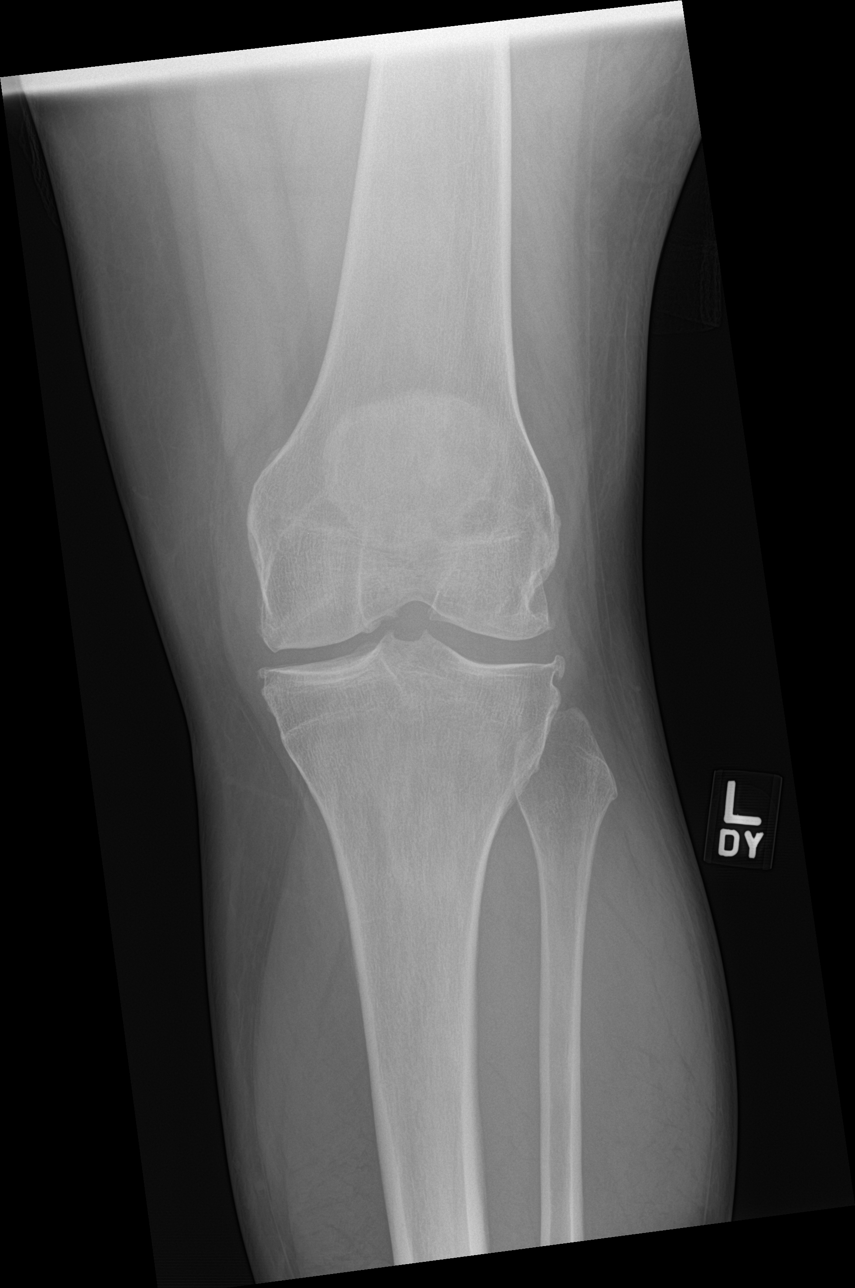

[knee lat]
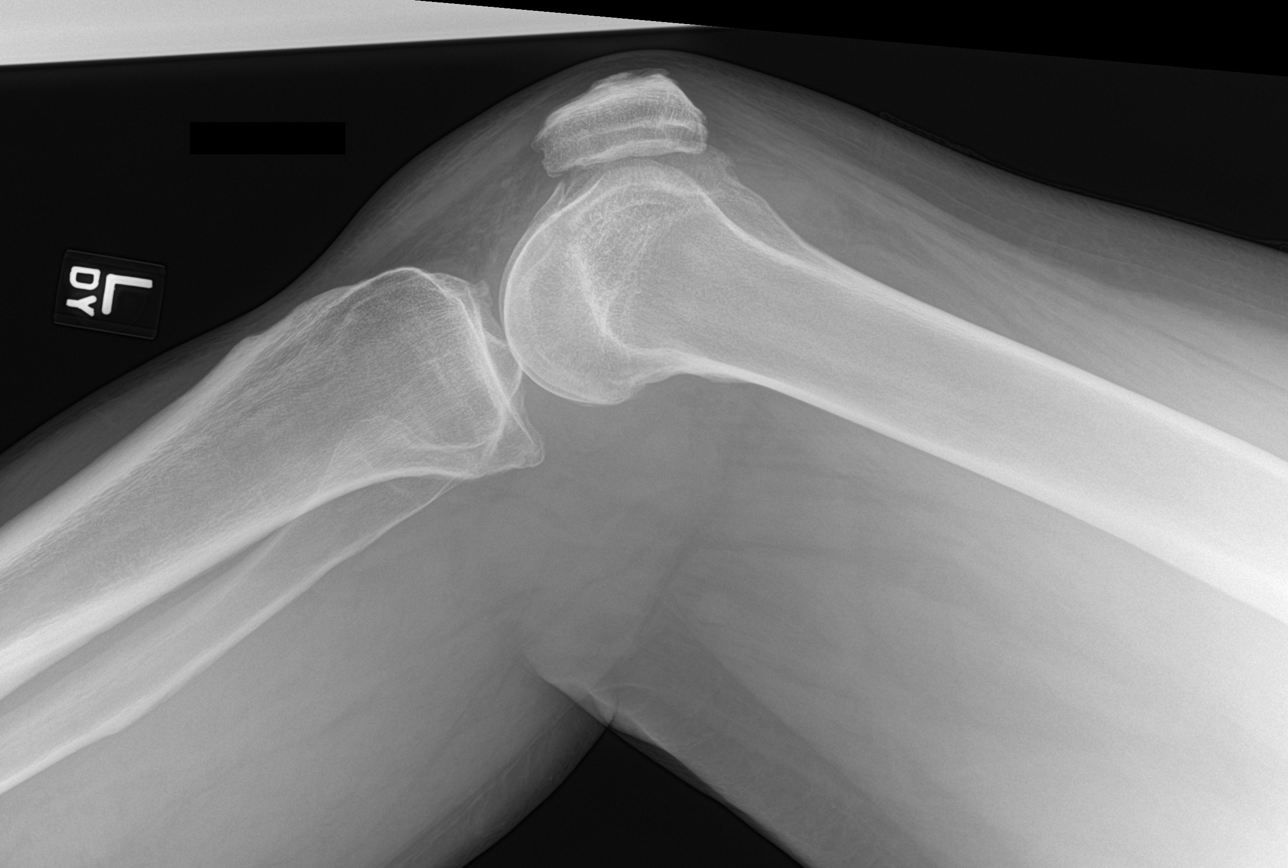

[knee obl (1 of 2)]
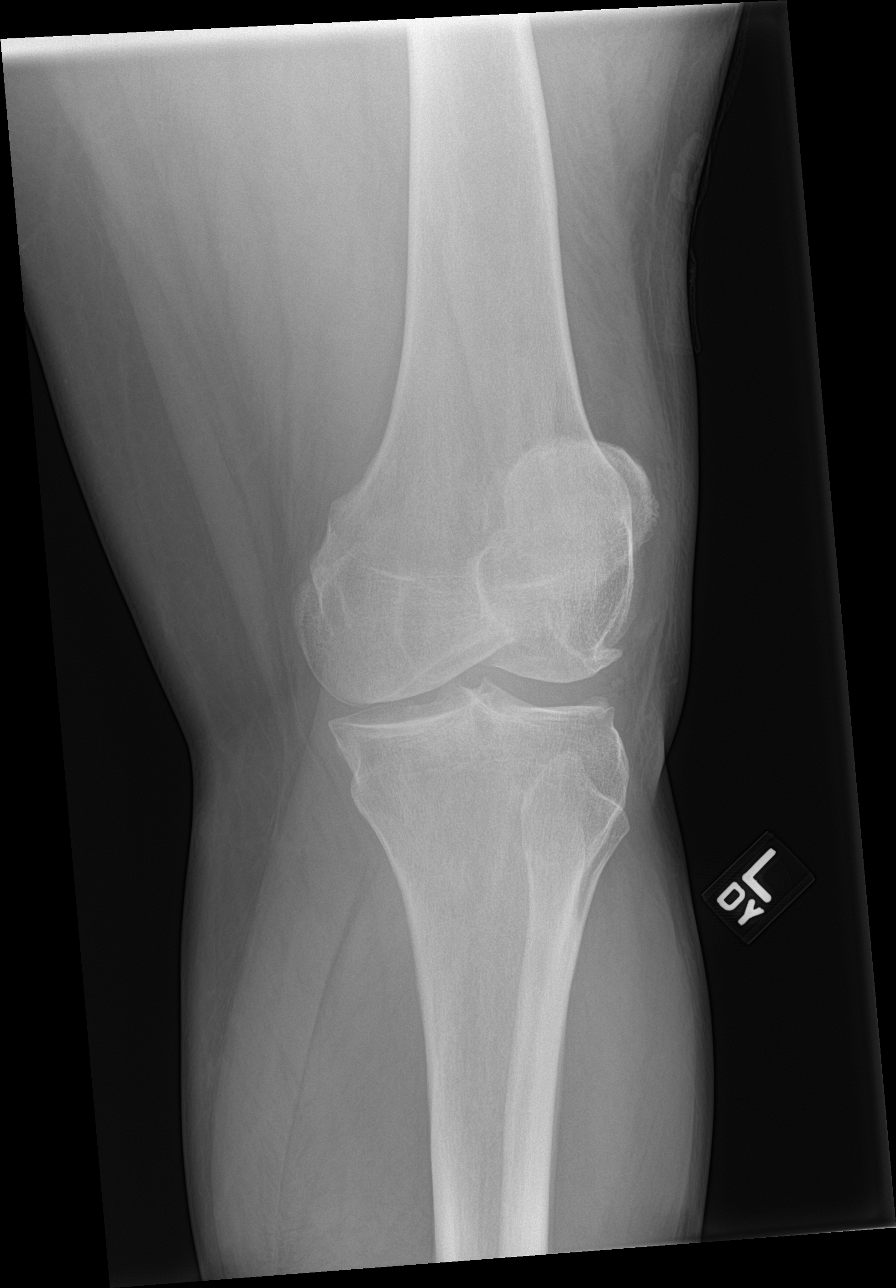

[knee obl (2 of 2)]
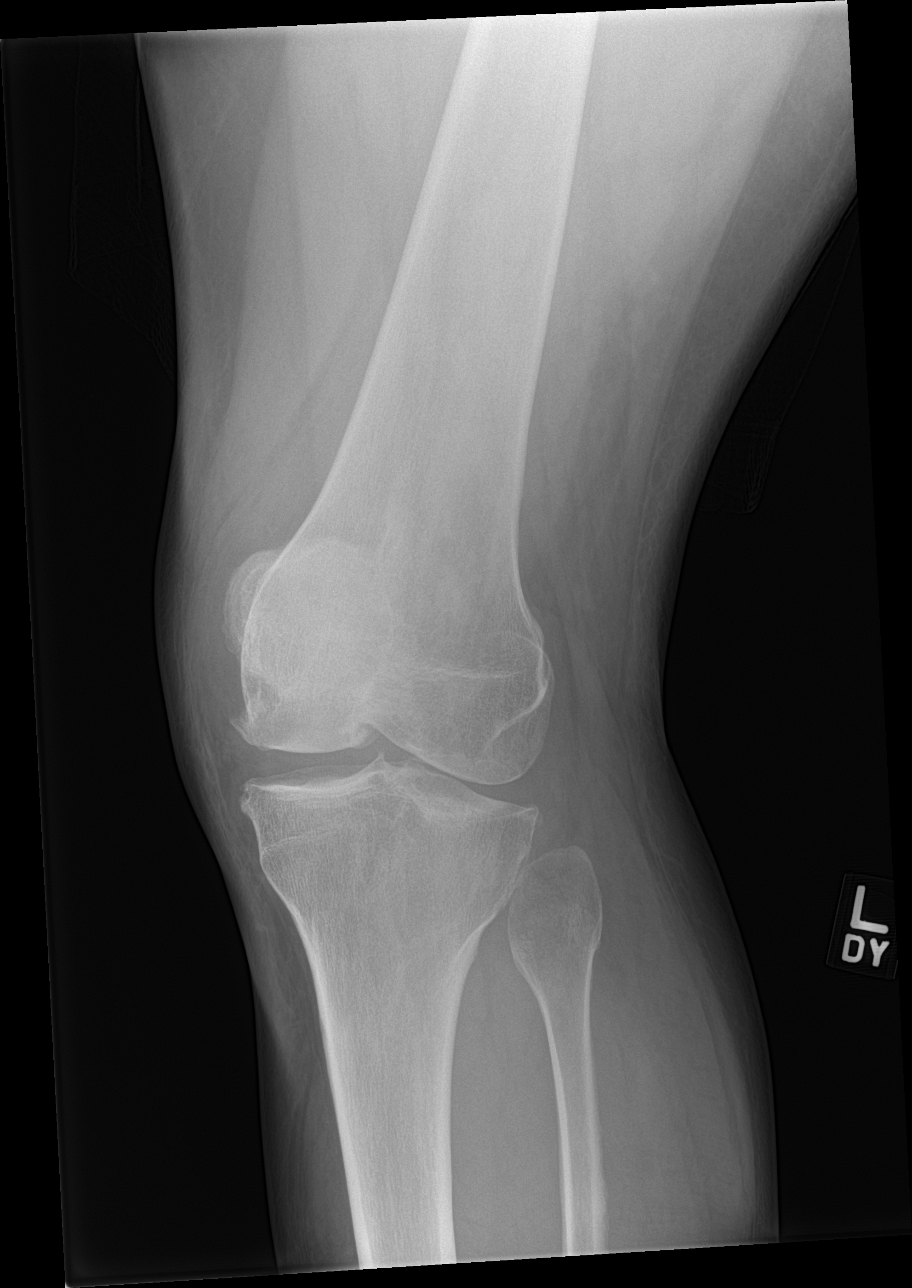

[4 of 4 positions shown; findings below may reference images not displayed]

FINDINGS: Moderate tricompartment osteoarthritis is noted with marginal spur
formation and sharpening the tibial spines. No fracture or
dislocation identified. Tiny suprapatellar joint effusion noted. No
radiopaque foreign bodies.
IMPRESSION: 1. Moderate tricompartment osteoarthritis.
2. Tiny suprapatellar joint effusion.

## 2020-09-23 ENCOUNTER — Encounter (HOSPITAL_COMMUNITY): Payer: Self-pay | Admitting: *Deleted

## 2020-09-23 ENCOUNTER — Emergency Department (HOSPITAL_COMMUNITY)
Admission: EM | Admit: 2020-09-23 | Discharge: 2020-09-23 | Disposition: A | Discharge status: Home / Self Care | Payer: BLUE CROSS/BLUE SHIELD | Attending: Emergency Medicine | Admitting: Emergency Medicine

## 2020-09-23 DIAGNOSIS — F17210 Nicotine dependence, cigarettes, uncomplicated: Secondary | ICD-10-CM | POA: Diagnosis not present

## 2020-09-23 DIAGNOSIS — M1000 Idiopathic gout, unspecified site: Secondary | ICD-10-CM | POA: Diagnosis not present

## 2020-09-23 DIAGNOSIS — M79671 Pain in right foot: Secondary | ICD-10-CM | POA: Diagnosis present

## 2020-09-23 DIAGNOSIS — J45909 Unspecified asthma, uncomplicated: Secondary | ICD-10-CM | POA: Diagnosis not present

## 2020-09-23 LAB — COMPREHENSIVE METABOLIC PANEL
ALT: 41 U/L (ref 0–44)
AST: 23 U/L (ref 15–41)
Albumin: 3.7 g/dL (ref 3.5–5.0)
Alkaline Phosphatase: 68 U/L (ref 38–126)
Anion gap: 7 (ref 5–15)
BUN: 12 mg/dL (ref 6–20)
CO2: 26 mmol/L (ref 22–32)
Calcium: 8.9 mg/dL (ref 8.9–10.3)
Chloride: 102 mmol/L (ref 98–111)
Creatinine, Ser: 0.93 mg/dL (ref 0.61–1.24)
GFR, Estimated: >60 mL/min (ref >60)
Glucose, Bld: 97 mg/dL (ref 70–99)
Potassium: 4.4 mmol/L (ref 3.5–5.1)
Sodium: 135 mmol/L (ref 135–145)
Total Bilirubin: 0.8 mg/dL (ref 0.3–1.2)
Total Protein: 7.8 g/dL (ref 6.5–8.1)

## 2020-09-23 LAB — CBC WITH DIFFERENTIAL/PLATELET
Abs Immature Granulocytes: 0.02 10*3/uL (ref 0.00–0.07)
Basophils Absolute: 0.1 10*3/uL (ref 0.0–0.1)
Basophils Relative: 1 %
Eosinophils Absolute: 0.2 10*3/uL (ref 0.0–0.5)
Eosinophils Relative: 2 %
HCT: 43.4 % (ref 39.0–52.0)
Hemoglobin: 14.3 g/dL (ref 13.0–17.0)
Immature Granulocytes: 0 %
Lymphocytes Relative: 17 %
Lymphs Abs: 1.8 10*3/uL (ref 0.7–4.0)
MCH: 28.5 pg (ref 26.0–34.0)
MCHC: 32.9 g/dL (ref 30.0–36.0)
MCV: 86.5 fL (ref 80.0–100.0)
Monocytes Absolute: 0.8 10*3/uL (ref 0.1–1.0)
Monocytes Relative: 8 %
Neutro Abs: 7.3 10*3/uL (ref 1.7–7.7)
Neutrophils Relative %: 72 %
Platelets: 496 10*3/uL — ABNORMAL HIGH (ref 150–400)
RBC: 5.02 MIL/uL (ref 4.22–5.81)
RDW: 13.5 % (ref 11.5–15.5)
WBC: 10.1 10*3/uL (ref 4.0–10.5)
nRBC: 0 % (ref 0.0–0.2)

## 2020-09-23 LAB — URIC ACID: Uric Acid, Serum: 10 mg/dL — ABNORMAL HIGH (ref 3.7–8.6)

## 2020-09-23 MED ORDER — METHYLPREDNISOLONE SODIUM SUCC 125 MG IJ SOLR
125.0000 mg | Freq: Once | INTRAMUSCULAR | Status: AC
  Administered 2020-09-23: 125 mg via INTRAMUSCULAR
  Filled 2020-09-23: qty 2

## 2020-09-23 NOTE — ED Provider Notes (Signed)
[facility] EMERGENCY DEPARTMENT Provider Note   CSN: [id number] Arrival date & time: 09/23/20  1107     History No chief complaint on file.   Jesus Singleton is a 35 y.o. male.  Patient has a history of gout.  Recently he had a gouty flareup in his left foot and is on medication for that.  Now he is having pain in his right foot  The history is provided by the patient. No language interpreter was used.  Foot Pain This is a new (Pain in right foot) problem. The problem occurs constantly. The problem has not changed since onset.Pertinent negatives include no chest pain, no abdominal pain and no headaches. Nothing aggravates the symptoms. Nothing relieves the symptoms. He has tried nothing for the symptoms. The treatment provided no relief.       Past Medical History:  Diagnosis Date  . Asthma   . Gout     There are no problems to display for this patient.   Past Surgical History:  Procedure Laterality Date  . bilateral leg surgery    . FRACTURE SURGERY Bilateral    lower extremities  . KNEE ARTHROSCOPY Left 12/24/2018   Procedure: left knee arthroscopy,partial medial lateral menisectomy,chondroplastyt;  Surgeon: Signa Kell, MD;  Location: [facility] ORS;  Service: Orthopedics;  Laterality: Left;       No family history on file.  Social History   Tobacco Use  . Smoking status: Current Every Day Smoker    Packs/day: 0.20    Types: Cigarettes    Last attempt to quit: 07/14/2018    Years since quitting: 2.1  . Smokeless tobacco: Never Used  Vaping Use  . Vaping Use: Never used  Substance Use Topics  . Alcohol use: No  . Drug use: No    Home Medications Prior to Admission medications   Medication Sig Start Date End Date Taking? Authorizing Provider  albuterol (VENTOLIN HFA) 108 (90 Base) MCG/ACT inhaler Inhale 2 puffs into the lungs every 6 (six) hours as needed for wheezing or shortness of breath. 11/14/18   Irean Hong, MD  allopurinol (ZYLOPRIM) 300 MG tablet  Take 1 tablet by mouth. 08/16/19   [provider]  HM LIDOCAINE PATCH EX Apply 1 patch topically daily as needed.    [provider]  ibuprofen (ADVIL) 800 MG tablet Take 1 tablet (800 mg total) by mouth 3 (three) times daily. Take with food 11/30/19   Triplett, Tammy, PA-C  methocarbamol (ROBAXIN) 500 MG tablet Take 1 tablet (500 mg total) by mouth 3 (three) times daily. 11/30/19   Triplett, Tammy, PA-C  oxyCODONE (OXY IR/ROXICODONE) 5 MG immediate release tablet Take 1 tablet by mouth at bedtime. 10/05/19   [provider]  predniSONE (DELTASONE) 10 MG tablet Take 6 tabs day 1, 5 tabs day 2, 4 tabs day 3, 3 tabs day 4, 2 tabs day 5, 1 tab day 6 09/26/19   Lorre Munroe, NP    Allergies    Shellfish allergy and Tramadol  Review of Systems   Review of Systems  Constitutional: Negative for appetite change and fatigue.  HENT: Negative for congestion, ear discharge and sinus pressure.   Eyes: Negative for discharge.  Respiratory: Negative for cough.   Cardiovascular: Negative for chest pain.  Gastrointestinal: Negative for abdominal pain and diarrhea.  Genitourinary: Negative for frequency and hematuria.  Musculoskeletal: Negative for back pain.       Right foot pain  Skin: Negative for rash.  Neurological:  Negative for seizures and headaches.  Psychiatric/Behavioral: Negative for hallucinations.    Physical Exam Updated Vital Signs BP 124/87   Pulse 89   Temp 99.4 F (37.4 C) (Oral)   Resp 16   Ht 6\' 1"  (1.854 m)   Wt 129.7 kg   SpO2 100%   BMI 37.73 kg/m   Physical Exam Vitals and nursing note reviewed.  Constitutional:      Appearance: He is well-developed.  HENT:     Head: Normocephalic.     Nose: Nose normal.  Eyes:     General: No scleral icterus.    Conjunctiva/sclera: Conjunctivae normal.  Neck:     Thyroid: No thyromegaly.  Cardiovascular:     Rate and Rhythm: Normal rate and regular rhythm.     Heart sounds: No murmur heard. No  friction rub. No gallop.   Pulmonary:     Breath sounds: No stridor. No wheezing or rales.  Chest:     Chest wall: No tenderness.  Abdominal:     General: There is no distension.     Tenderness: There is no abdominal tenderness. There is no rebound.  Musculoskeletal:     Cervical back: Neck supple.     Comments: Tenderness to the top of her right foot  Lymphadenopathy:     Cervical: No cervical adenopathy.  Skin:    Findings: No erythema or rash.  Neurological:     Mental Status: He is alert and oriented to person, place, and time.     Motor: No abnormal muscle tone.     Coordination: Coordination normal.  Psychiatric:        Behavior: Behavior normal.     ED Results / Procedures / Treatments   Labs (all labs ordered are listed, but only abnormal results are displayed) Labs Reviewed  CBC WITH DIFFERENTIAL/PLATELET - Abnormal; Notable for the following components:      Result Value   Platelets 496 (*)    All other components within normal limits  URIC ACID - Abnormal; Notable for the following components:   Uric Acid, Serum 10.0 (*)    All other components within normal limits  COMPREHENSIVE METABOLIC PANEL    EKG None  Radiology No results found.  Procedures Procedures   Medications Ordered in ED Medications  methylPREDNISolone sodium succinate (SOLU-MEDROL) 125 mg/2 mL injection 125 mg (has no administration in time range)    ED Course  I have reviewed the triage vital signs and the nursing notes.  Pertinent labs & imaging results that were available during my care of the patient were reviewed by me and considered in my medical decision making (see chart for details).    MDM Rules/Calculators/A&P                         Patient with a gouty flareup.  He will continue taking the prednisone and colchicine and pain medicine he was given by Dr. week.  We will give him an injection of steroids to see if that can help and he will follow-up next week Final  Clinical Impression(s) / ED Diagnoses Final diagnoses:  Acute idiopathic gout, unspecified site    Rx / DC Orders ED Discharge Orders    None       Maurice Small, MD 09/27/20 [phone]

## 2020-09-23 NOTE — ED Triage Notes (Signed)
States he is being treated for gout and now has swelling in left knee and right foot. States he cannot walk without crutches

## 2020-09-23 NOTE — Discharge Instructions (Addendum)
Continue taking your gout medicine and follow-up with your doctor next week for recheck

## 2022-03-09 ENCOUNTER — Encounter (HOSPITAL_COMMUNITY): Payer: Self-pay

## 2022-03-09 ENCOUNTER — Other Ambulatory Visit: Payer: Self-pay

## 2022-03-09 ENCOUNTER — Emergency Department (HOSPITAL_COMMUNITY)
Admission: EM | Admit: 2022-03-09 | Discharge: 2022-03-10 | Disposition: A | Discharge status: Home / Self Care | Payer: BLUE CROSS/BLUE SHIELD | Attending: Emergency Medicine | Admitting: Emergency Medicine

## 2022-03-09 DIAGNOSIS — M10079 Idiopathic gout, unspecified ankle and foot: Secondary | ICD-10-CM | POA: Insufficient documentation

## 2022-03-09 DIAGNOSIS — J45909 Unspecified asthma, uncomplicated: Secondary | ICD-10-CM | POA: Insufficient documentation

## 2022-03-09 LAB — URIC ACID: Uric Acid, Serum: 11.1 mg/dL — ABNORMAL HIGH (ref 3.7–8.6)

## 2022-03-09 MED ORDER — PREDNISONE 50 MG PO TABS
60.0000 mg | ORAL_TABLET | Freq: Once | ORAL | Status: AC
  Administered 2022-03-10: 60 mg via ORAL
  Filled 2022-03-09: qty 1

## 2022-03-09 MED ORDER — OXYCODONE HCL 5 MG PO TABS: 5.0000 mg | ORAL_TABLET | Freq: Every day | ORAL | 0 refills | Status: DC

## 2022-03-09 MED ORDER — PREDNISONE 50 MG PO TABS: 50.0000 mg | ORAL_TABLET | Freq: Every day | ORAL | 0 refills | Status: DC

## 2022-03-09 MED ORDER — OXYCODONE-ACETAMINOPHEN 5-325 MG PO TABS
2.0000 | ORAL_TABLET | Freq: Once | ORAL | Status: AC
  Administered 2022-03-10: 2 via ORAL
  Filled 2022-03-09: qty 2

## 2022-03-09 MED ORDER — OXYCODONE-ACETAMINOPHEN 5-325 MG PO TABS: 1.0000 | ORAL_TABLET | ORAL | 0 refills | Status: AC | PRN

## 2022-03-09 NOTE — ED Triage Notes (Signed)
Pt arrived via POV c/o bilateral foot pain and swelling that has worsened over the past 4 days. Pt reports Hx of Gout and has been taking his Colchicine w/o relief.

## 2022-03-09 NOTE — Discharge Instructions (Signed)
Take ibuprofen as needed for pain.  If you need additional pain relief, add acetaminophen.  Reserve oxycodone for pain not relieved by the combination of ibuprofen and acetaminophen.

## 2022-03-09 NOTE — ED Provider Notes (Signed)
[facility] EMERGENCY DEPARTMENT Provider Note   CSN: [id number] Arrival date & time: 03/09/22  2044     History  Chief Complaint  Patient presents with   Foot Pain    Jesus Singleton is a 36 y.o. male.  The history is provided by the patient.  Foot Pain  She has history of asthma, gout and comes in because of pain in both feet typical of his gout.  Pain started 5 days ago and has been getting worse.  His right foot is worse than the left.  He states that he has been compliant with his allopurinol and has been taking daily colchicine.   Home Medications Prior to Admission medications   Medication Sig Start Date End Date Taking? Authorizing Provider  albuterol (VENTOLIN HFA) 108 (90 Base) MCG/ACT inhaler Inhale 2 puffs into the lungs every 6 (six) hours as needed for wheezing or shortness of breath. 11/14/18   Paulette Blanch, MD  allopurinol (ZYLOPRIM) 300 MG tablet Take 1 tablet by mouth. 08/16/19   [provider]  HM LIDOCAINE PATCH EX Apply 1 patch topically daily as needed.    [provider]  ibuprofen (ADVIL) 800 MG tablet Take 1 tablet (800 mg total) by mouth 3 (three) times daily. Take with food 11/30/19   Triplett, Tammy, PA-C  methocarbamol (ROBAXIN) 500 MG tablet Take 1 tablet (500 mg total) by mouth 3 (three) times daily. 11/30/19   Triplett, Tammy, PA-C  oxyCODONE (OXY IR/ROXICODONE) 5 MG immediate release tablet Take 1 tablet by mouth at bedtime. 10/05/19   [provider]  predniSONE (DELTASONE) 10 MG tablet Take 6 tabs day 1, 5 tabs day 2, 4 tabs day 3, 3 tabs day 4, 2 tabs day 5, 1 tab day 6 09/26/19   Jearld Fenton, NP      Allergies    Shellfish allergy and Tramadol    Review of Systems   Review of Systems  All other systems reviewed and are negative.   Physical Exam Updated Vital Signs BP (!) 144/97 (BP Location: Right Arm)   Pulse 95   Temp 98.4 F (36.9 C) (Oral)   Resp 17   Ht 6\' 1"  (1.854 m)   Wt 129.7 kg   SpO2 96%    BMI 37.72 kg/m  Physical Exam Vitals and nursing note reviewed.   36 year old male, resting comfortably and in no acute distress. Vital signs are significant for elevated blood pressure. Oxygen saturation is 96%, which is normal. Head is normocephalic and atraumatic. PERRLA, EOMI. Oropharynx is clear. Neck is nontender and supple without adenopathy or JVD. Back is nontender and there is no CVA tenderness. Lungs are clear without rales, wheezes, or rhonchi. Chest is nontender. Heart has regular rate and rhythm without murmur. Abdomen is soft, flat, nontender. Extremities have no cyanosis or edema, full range of motion is present.  Moderate swelling is noted to the right first MTP joint with mild swelling noted to the left first MTP joint.  There is tenderness palpation over the right first MTP joint but no significant erythema or warmth.  Overall, picture is felt to be consistent with acute gouty arthritis. Neurologic: Mental status is normal, cranial nerves are intact, moves all extremities equally.  ED Results / Procedures / Treatments   Labs (all labs ordered are listed, but only abnormal results are displayed) Labs Reviewed  URIC ACID   Procedures Procedures    Medications Ordered in ED Medications  predniSONE (DELTASONE) tablet  60 mg (has no administration in time range)  oxyCODONE-acetaminophen (PERCOCET/ROXICET) 5-325 MG per tablet 2 tablet (has no administration in time range)    ED Course/ Medical Decision Making/ A&P                           Medical Decision Making Amount and/or Complexity of Data Reviewed Labs: ordered.   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  Old records are reviewed, and he had an elevated uric acid of 10.0 on 09/23/2020.  I reviewed and interpreted his laboratory test, and my interpretation is worsening of uric acid elevation.  I have ordered a dose of prednisone and a dose of oxycodone-acetaminophen.  I am ordering prescriptions  of prednisone, oxycodone and also a take-home pack of oxycodone-acetaminophen.  He will need to work with his primary care provider to get adequate uric acid level reduction.  He states that his primary care provider has moved and he needs to find a new one.  I have given him the hospital health connect phone number to try to get a new primary care provider.  Final Clinical Impression(s) / ED Diagnoses Final diagnoses:  Acute idiopathic gout of foot, unspecified laterality    Rx / DC Orders ED Discharge Orders          Ordered    oxyCODONE (OXY IR/ROXICODONE) 5 MG immediate release tablet  Daily at bedtime        03/09/22 2339    oxyCODONE-acetaminophen (PERCOCET) 5-325 MG tablet  Every 4 hours PRN        03/09/22 2339    predniSONE (DELTASONE) 50 MG tablet  Daily        03/09/22 2339              Dione Booze, MD 03/09/22 2344

## 2022-03-11 ENCOUNTER — Telehealth (HOSPITAL_COMMUNITY): Payer: Self-pay | Admitting: Emergency Medicine

## 2022-03-11 MED ORDER — OXYCODONE HCL 5 MG PO TABS: 5.0000 mg | ORAL_TABLET | Freq: Every day | ORAL | 0 refills | Status: AC

## 2022-03-11 MED ORDER — PREDNISONE 50 MG PO TABS: 50.0000 mg | ORAL_TABLET | Freq: Every day | ORAL | 0 refills | Status: AC

## 2022-03-11 MED FILL — Oxycodone w/ Acetaminophen Tab 5-325 MG: ORAL | Qty: 6 | Status: AC

## 2022-03-11 NOTE — Telephone Encounter (Signed)
Charge nurse contacted by patient he states that the pharmacy did not have his oxycodone and prednisone.  Will prescribe to [facility] at [facility].

## 2022-08-05 ENCOUNTER — Emergency Department (HOSPITAL_COMMUNITY)
Admission: EM | Admit: 2022-08-05 | Discharge: 2022-08-05 | Disposition: A | Discharge status: Home / Self Care | Payer: Self-pay | Attending: Emergency Medicine | Admitting: Emergency Medicine

## 2022-08-05 ENCOUNTER — Other Ambulatory Visit: Payer: Self-pay

## 2022-08-05 ENCOUNTER — Encounter (HOSPITAL_COMMUNITY): Payer: Self-pay | Admitting: *Deleted

## 2022-08-05 ENCOUNTER — Emergency Department (HOSPITAL_COMMUNITY): Payer: Self-pay

## 2022-08-05 DIAGNOSIS — J45909 Unspecified asthma, uncomplicated: Secondary | ICD-10-CM | POA: Insufficient documentation

## 2022-08-05 DIAGNOSIS — W000XXA Fall on same level due to ice and snow, initial encounter: Secondary | ICD-10-CM | POA: Insufficient documentation

## 2022-08-05 DIAGNOSIS — M25562 Pain in left knee: Secondary | ICD-10-CM | POA: Insufficient documentation

## 2022-08-05 MED ORDER — HYDROCODONE-ACETAMINOPHEN 5-325 MG PO TABS: 2.0000 | ORAL_TABLET | Freq: Four times a day (QID) | ORAL | 0 refills | Status: AC | PRN

## 2022-08-05 MED ORDER — HYDROCODONE-ACETAMINOPHEN 5-325 MG PO TABS
1.0000 | ORAL_TABLET | Freq: Once | ORAL | Status: AC
  Administered 2022-08-05: 1 via ORAL
  Filled 2022-08-05: qty 1

## 2022-08-05 NOTE — ED Notes (Signed)
Pt returned from xray via wheelchair

## 2022-08-05 NOTE — ED Provider Notes (Signed)
[facility] Provider Note   CSN: [id number] Arrival date & time: 08/05/22  1831     History  Chief Complaint  Patient presents with   Leg Pain    Jesus Singleton is a 37 y.o. male.  With history of asthma and gout who presents to the ED for evaluation of left knee pain.  He says that he fell on black ice approximately 9 weeks ago.  He fell forward and hit his left knee onto the step of his semitrailer and twisted his left foot underneath.  He was able to ambulate without difficulty the next day but since then has had some difficulty ambulating due to the pain.  He denies numbness, weakness or tingling.  He initially thought that it may be a flareup of his gout, however his gout typically only lasts a few days and then resolves.  He denies fevers or chills.  He states he has some swelling to the left knee.  Denies calf pain or swelling.  Has been taking ibuprofen at home with minimal relief.  He has an appointment with his primary care provider next week but states that the pain is too great at this time.  He does take allopurinol daily for gout control.   Leg Pain      Home Medications Prior to Admission medications   Medication Sig Start Date End Date Taking? Authorizing Provider  HYDROcodone-acetaminophen (NORCO/VICODIN) 5-325 MG tablet Take 2 tablets by mouth every 6 (six) hours as needed. 08/05/22  Yes Mikaella Escalona, [place] Folk, PA-C  albuterol (VENTOLIN HFA) 108 (90 Base) MCG/ACT inhaler Inhale 2 puffs into the lungs every 6 (six) hours as needed for wheezing or shortness of breath. 11/14/18   Paulette Blanch, MD  allopurinol (ZYLOPRIM) 300 MG tablet Take 1 tablet by mouth. 08/16/19   [provider]  HM LIDOCAINE PATCH EX Apply 1 patch topically daily as needed.    [provider]  ibuprofen (ADVIL) 800 MG tablet Take 1 tablet (800 mg total) by mouth 3 (three) times daily. Take with food 11/30/19   Triplett, Tammy, PA-C  oxyCODONE  (OXY IR/ROXICODONE) 5 MG immediate release tablet Take 1 tablet (5 mg total) by mouth at bedtime. 03/11/22   Fransico Meadow, MD  oxyCODONE-acetaminophen (PERCOCET) 5-325 MG tablet Take 1 tablet by mouth every 4 (four) hours as needed for moderate pain. [id number]   Delora Fuel, MD  predniSONE (DELTASONE) 50 MG tablet Take 1 tablet (50 mg total) by mouth daily. 03/11/22   Fransico Meadow, MD      Allergies    Shellfish allergy and Tramadol    Review of Systems   Review of Systems  Musculoskeletal:  Positive for arthralgias.  All other systems reviewed and are negative.   Physical Exam Updated Vital Signs BP (!) 135/97 (BP Location: Right Arm)   Pulse 98   Temp 98.3 F (36.8 C) (Oral)   Resp 18   Ht 6\' 1"  (1.854 m)   Wt (!) 145.2 kg   SpO2 100%   BMI 42.22 kg/m  Physical Exam Vitals and nursing note reviewed.  Constitutional:      General: He is not in acute distress.    Appearance: Normal appearance. He is normal weight. He is not ill-appearing.  HENT:     Head: Normocephalic and atraumatic.  Pulmonary:     Effort: Pulmonary effort is normal. No respiratory distress.  Abdominal:     General: Abdomen is  flat.  Musculoskeletal:        General: Swelling (Left knee) present. Normal range of motion.     Cervical back: Neck supple.     Comments: Negative anterior and posterior drawer, varus and valgus stress tests to the left knee.  No left calf or foot swelling.  DP pulses 2+ bilaterally.  Capillary refill less than 2 seconds.  Sensation intact in bilateral lower extremities.  Skin:    General: Skin is warm and dry.     Capillary Refill: Capillary refill takes less than 2 seconds.     Comments: No erythema of the left knee, calf or foot.  No overlying lesions.  Not warm to the touch.  Neurological:     Mental Status: He is alert and oriented to person, place, and time.  Psychiatric:        Mood and Affect: Mood normal.        Behavior: Behavior normal.     ED  Results / Procedures / Treatments   Labs (all labs ordered are listed, but only abnormal results are displayed) Labs Reviewed - No data to display  EKG None  Radiology DG Knee Complete 4 Views Left  Result Date: 08/05/2022 CLINICAL DATA:  Left knee pain after a fall 9 weeks ago. EXAM: LEFT KNEE - COMPLETE 4+ VIEW COMPARISON:  11/30/2019 FINDINGS: Degenerative changes in the left knee with medial greater than lateral compartment narrowing and tricompartment osteophyte formation. No evidence of acute fracture or dislocation. No focal bone lesion or bone destruction. Moderate left knee effusion. Appearances are similar to prior study. 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 No acute bony abnormalities. Electronically Signed   By: Lucienne Capers M.D.   On: 08/05/2022 22:03    Procedures Procedures    Medications Ordered in ED Medications  HYDROcodone-acetaminophen (NORCO/VICODIN) 5-325 MG per tablet 1 tablet (1 tablet Oral Given 08/05/22 2119)    ED Course/ Medical Decision Making/ A&P                             Medical Decision Making Amount and/or Complexity of Data Reviewed Radiology: ordered.  Risk Prescription drug management.  This patient presents to the ED for concern of left knee pain, this involves an extensive number of treatment options.  The differential diagnosis includes fractures, strains, sprains, contusions, gout, septic arthritis  Co morbidities that complicate the patient evaluation   history of gout, obesity  My initial workup includes pain control, x-ray  Additional history obtained from: Nursing notes from this visit.  I ordered imaging studies including x-ray left knee I independently visualized and interpreted imaging which showed tricompartmental arthritis and moderate joint effusion I agree with the radiologist interpretation  Afebrile, hemodynamically stable.  37 year old male  presenting to the ED for evaluation of left knee pain for the past 9 weeks.  Does have a significant history of gout, however he did report a fall prior to the onset of symptoms.  There is some swelling to the left knee without warmth or erythema.  There were no penetrating injuries.  Low suspicion for infectious etiology of his symptoms.  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  This may be a gout flareup.  He does have an appointment with his primary care provider in the near future.  He will also be given contact information for orthopedics and encouraged to follow-up.  He will be sent a  prescription for Norco and educated on appropriate use.  He was given crutches for comfort.  He was given return precautions.  Stable at discharge.  At this time there does not appear to be any evidence of an acute emergency medical condition and the patient appears stable for discharge with appropriate outpatient follow up. Diagnosis was discussed with patient who verbalizes understanding of care plan and is agreeable to discharge. I have discussed return precautions with patient who verbalizes understanding. Patient encouraged to follow-up with their PCP within 1 week. All questions answered.  Note: Portions of this report may have been transcribed using voice recognition software. Every effort was made to ensure accuracy; however, inadvertent computerized transcription errors may still be present.        Final Clinical Impression(s) / ED Diagnoses Final diagnoses:  Acute pain of left knee    Rx / DC Orders ED Discharge Orders          Ordered    HYDROcodone-acetaminophen (NORCO/VICODIN) 5-325 MG tablet  Every 6 hours PRN        08/05/22 2245              Nehemiah Massed 08/05/22 2252    Fransico Meadow, MD 08/06/22 [phone]

## 2022-08-05 NOTE — ED Triage Notes (Signed)
Pt had slipped on some ice about 9 weeks ago, c/o pain to left knee down lower leg to foot.  Pt states swelling to left knee, not able to put weight on it.

## 2022-08-05 NOTE — Discharge Instructions (Addendum)
You have been seen today for your complaint of left knee pain. Your imaging showed arthritis and some swelling but was otherwise reassuring. Your discharge medications include Norco. This is an opioid pain medication. You should only take this medication as needed for severe pain. You should not drive, operate heavy machinery or make important decisions while taking this medication. You should use alternative methods for pain relief while taking this medication including stretching, gentle range of motion, and alternating tylenol and ibuprofen. Follow up with: Dr. Mable Fill.  He is an Doctor, general practice.  Call tomorrow to schedule an appointment for an ED follow-up visit Please seek immediate medical care if you develop any of the following symptoms: You cannot move the joint. Your fingers or toes tingle, become numb, or turn cold and blue. You have a fever along with a joint that is red, warm, and swollen. At this time there does not appear to be the presence of an emergent medical condition, however there is always the potential for conditions to change. Please read and follow the below instructions.  Do not take your medicine if  develop an itchy rash, swelling in your mouth or lips, or difficulty breathing; call 911 and seek immediate emergency medical attention if this occurs.  You may review your lab tests and imaging results in their entirety on your MyChart account.  Please discuss all results of fully with your primary care provider and other specialist at your follow-up visit.  Note: Portions of this text may have been transcribed using voice recognition software. Every effort was made to ensure accuracy; however, inadvertent computerized transcription errors may still be present.

## 2022-09-27 ENCOUNTER — Other Ambulatory Visit (HOSPITAL_COMMUNITY): Payer: Self-pay | Admitting: Family Medicine

## 2022-09-27 DIAGNOSIS — M25562 Pain in left knee: Secondary | ICD-10-CM

## 2022-09-27 DIAGNOSIS — M25462 Effusion, left knee: Secondary | ICD-10-CM

## 2022-11-01 ENCOUNTER — Encounter (HOSPITAL_COMMUNITY): Payer: Self-pay

## 2022-11-01 ENCOUNTER — Ambulatory Visit (HOSPITAL_COMMUNITY): Payer: Self-pay

## 2022-11-26 ENCOUNTER — Ambulatory Visit (HOSPITAL_COMMUNITY)
Admission: RE | Admit: 2022-11-26 | Discharge: 2022-11-26 | Disposition: A | Discharge status: Home / Self Care | Payer: Self-pay | Source: Ambulatory Visit | Attending: Family Medicine | Admitting: Family Medicine

## 2022-11-26 DIAGNOSIS — M25562 Pain in left knee: Secondary | ICD-10-CM | POA: Insufficient documentation

## 2022-11-26 DIAGNOSIS — M25462 Effusion, left knee: Secondary | ICD-10-CM | POA: Insufficient documentation

## 2023-03-08 ENCOUNTER — Other Ambulatory Visit: Payer: Self-pay

## 2023-03-08 ENCOUNTER — Emergency Department: Payer: Self-pay

## 2023-03-08 ENCOUNTER — Emergency Department
Admission: EM | Admit: 2023-03-08 | Discharge: 2023-03-08 | Discharge status: Against Medical Advice | Payer: Self-pay | Attending: Student in an Organized Health Care Education/Training Program | Admitting: Student in an Organized Health Care Education/Training Program

## 2023-03-08 ENCOUNTER — Encounter: Payer: Self-pay | Admitting: Emergency Medicine

## 2023-03-08 DIAGNOSIS — R0789 Other chest pain: Secondary | ICD-10-CM | POA: Insufficient documentation

## 2023-03-08 DIAGNOSIS — R079 Chest pain, unspecified: Secondary | ICD-10-CM

## 2023-03-08 LAB — CBC WITH DIFFERENTIAL/PLATELET
Abs Immature Granulocytes: 0.03 10*3/uL (ref 0.00–0.07)
Basophils Absolute: 0.1 10*3/uL (ref 0.0–0.1)
Basophils Relative: 1 %
Eosinophils Absolute: 0.1 10*3/uL (ref 0.0–0.5)
Eosinophils Relative: 1 %
HCT: 41.8 % (ref 39.0–52.0)
Hemoglobin: 14.1 g/dL (ref 13.0–17.0)
Immature Granulocytes: 0 %
Lymphocytes Relative: 22 %
Lymphs Abs: 2.3 10*3/uL (ref 0.7–4.0)
MCH: 27.8 pg (ref 26.0–34.0)
MCHC: 33.7 g/dL (ref 30.0–36.0)
MCV: 82.3 fL (ref 80.0–100.0)
Monocytes Absolute: 0.7 10*3/uL (ref 0.1–1.0)
Monocytes Relative: 7 %
Neutro Abs: 7 10*3/uL (ref 1.7–7.7)
Neutrophils Relative %: 69 %
Platelets: 325 10*3/uL (ref 150–400)
RBC: 5.08 MIL/uL (ref 4.22–5.81)
RDW: 15.5 % (ref 11.5–15.5)
WBC: 10.3 10*3/uL (ref 4.0–10.5)
nRBC: 0 % (ref 0.0–0.2)

## 2023-03-08 LAB — COMPREHENSIVE METABOLIC PANEL
ALT: 22 U/L (ref 0–44)
AST: 23 U/L (ref 15–41)
Albumin: 4.3 g/dL (ref 3.5–5.0)
Alkaline Phosphatase: 61 U/L (ref 38–126)
Anion gap: 7 (ref 5–15)
BUN: 11 mg/dL (ref 6–20)
CO2: 23 mmol/L (ref 22–32)
Calcium: 8.7 mg/dL — ABNORMAL LOW (ref 8.9–10.3)
Chloride: 108 mmol/L (ref 98–111)
Creatinine, Ser: 1.22 mg/dL (ref 0.61–1.24)
GFR, Estimated: >60 mL/min (ref >60)
Glucose, Bld: 98 mg/dL (ref 70–99)
Potassium: 3.4 mmol/L — ABNORMAL LOW (ref 3.5–5.1)
Sodium: 138 mmol/L (ref 135–145)
Total Bilirubin: 1 mg/dL (ref 0.3–1.2)
Total Protein: 7.6 g/dL (ref 6.5–8.1)

## 2023-03-08 LAB — TROPONIN I (HIGH SENSITIVITY)
Troponin I (High Sensitivity): 11 ng/L (ref <18)
Troponin I (High Sensitivity): 11 ng/L (ref <18)

## 2023-03-08 MED ORDER — DIAZEPAM 2 MG PO TABS
2.0000 mg | ORAL_TABLET | Freq: Once | ORAL | Status: AC
  Administered 2023-03-08: 2 mg via ORAL
  Filled 2023-03-08: qty 1

## 2023-03-08 MED ORDER — METOPROLOL TARTRATE 25 MG PO TABS
25.0000 mg | ORAL_TABLET | Freq: Once | ORAL | Status: AC
  Administered 2023-03-08: 25 mg via ORAL
  Filled 2023-03-08: qty 1

## 2023-03-08 NOTE — ED Notes (Signed)
Patient insists on leaving; was agreeable to this RN drawing a second troponin level but still would like to leave. This RN advised patient to follow up with PCP or return to ED if symptoms worsen. Notified Dr. Roxan Hockey patient is leaving AMA.

## 2023-03-08 NOTE — ED Triage Notes (Signed)
Pt via POV from a funeral. Pt had a sudden onset of L sided chest pain. States he take Metoprolol for tachycardia. Pt states the pain radiates to his back. On arrival, pt very diaphoretic and anxious. States he starts to stutter when he gets nervous. Pt is A&OX4 and NAD at this time.

## 2023-03-08 NOTE — ED Provider Notes (Signed)
[facility] Provider Note    Event Date/Time   First MD Initiated Contact with Patient 03/08/23 1519     (approximate)   History   Chest Pain   HPI  Jesus Singleton is a 37 y.o. male presents funeral home with chief complaint of chest discomfort rating to left jaw and left arm.  States that he was feeling fine until he started feeling nervous and anxious in the funeral home.  Does have a history of anxiety and panic attacks.  Denies any pleuritic chest pain.  No wheezing.  No fevers.  Does take metoprolol for history of tachycardia did not take this morning's dose.  He denies any fevers or chills.     Physical Exam   Triage Vital Signs: ED Triage Vitals  Encounter Vitals Group     BP 03/08/23 1518 (!) 151/95     Systolic BP Percentile --      Diastolic BP Percentile --      Pulse Rate 03/08/23 1518 (!) 129     Resp 03/08/23 1518 20     Temp 03/08/23 1518 99.1 F (37.3 C)     Temp Source 03/08/23 1518 Oral     SpO2 03/08/23 1518 99 %     Weight 03/08/23 1514 290 lb (131.5 kg)     Height 03/08/23 1514 6\' 1"  (1.854 m)     Singleton Circumference --      Peak Flow --      Pain Score 03/08/23 1514 8     Pain Loc --      Pain Education --      Exclude from Growth Chart --     Most recent vital signs: Vitals:   03/08/23 1645 03/08/23 1700  BP: (!) 146/99 (!) 150/102  Pulse: (!) 103 91  Resp: (!) 9 13  Temp:    SpO2: 100% 100%     Constitutional: Alert  Eyes: Conjunctivae are normal.  Singleton: Atraumatic. Nose: No congestion/rhinnorhea. Mouth/Throat: Mucous membranes are moist.   Neck: Painless ROM.  Cardiovascular:   Good peripheral circulation. Respiratory: Normal respiratory effort.  No retractions.  Gastrointestinal: Soft and nontender.  Musculoskeletal:  no deformity Neurologic:  MAE spontaneously. No gross focal neurologic deficits are appreciated.  Skin:  Skin is warm, dry and intact. No rash noted. Psychiatric: Mood and affect are  normal. Speech and behavior are normal.    ED Results / Procedures / Treatments   Labs (all labs ordered are listed, but only abnormal results are displayed) Labs Reviewed  COMPREHENSIVE METABOLIC PANEL - Abnormal; Notable for the following components:      Result Value   Potassium 3.4 (*)    Calcium 8.7 (*)    All other components within normal limits  CBC WITH DIFFERENTIAL/PLATELET  TROPONIN I (HIGH SENSITIVITY)  TROPONIN I (HIGH SENSITIVITY)     EKG  ED ECG REPORT I, Willy Eddy, the attending physician, personally viewed and interpreted this ECG.   Date: 03/08/2023  EKG Time: 15:15  Rate: 130  Rhythm: sinus  Axis: normal  Intervals: normal  ST&T Change: no stemi, no depressions    RADIOLOGY Please see ED Course for my review and interpretation.  I personally reviewed all radiographic images ordered to evaluate for the above acute complaints and reviewed radiology reports and findings.  These findings were personally discussed with the patient.  Please see medical record for radiology report.    PROCEDURES:  Critical Care performed: No  Procedures  MEDICATIONS ORDERED IN ED: Medications  metoprolol tartrate (LOPRESSOR) tablet 25 mg (25 mg Oral Given 03/08/23 1620)  diazepam (VALIUM) tablet 2 mg (2 mg Oral Given 03/08/23 1620)     IMPRESSION / MDM / ASSESSMENT AND PLAN / ED COURSE  I reviewed the triage vital signs and the nursing notes.                              Differential diagnosis includes, but is not limited to, ACS, pericarditis, esophagitis, boerhaaves, pe, dissection, pna, bronchitis, costochondritis  Patient presenting to the ER for evaluation of symptoms as described above.  Based on symptoms, risk factors and considered above differential, this presenting complaint could reflect a potentially life-threatening illness therefore the patient will be placed on continuous pulse oximetry and telemetry for monitoring.  Laboratory  evaluation will be sent to evaluate for the above complaints.      Clinical Course as of 03/08/23 2309  Sat Mar 08, 2023  1729 Felt significant improvement in symptoms.  Heart rate still tachycardic.  Recommended staying for troponin further observation.  Have lower suspicion for PE.  Patient unwilling to wait for results of troponin and left AGAINST MEDICAL ADVICE.  Does have decision-making Capacity.  There is likely a large component of anxiety related to his presentation. [PR]    Clinical Course User Index [PR] Willy Eddy, MD     FINAL CLINICAL IMPRESSION(S) / ED DIAGNOSES   Final diagnoses:  Nonspecific chest pain     Rx / DC Orders   ED Discharge Orders     None        Note:  This document was prepared using Dragon voice recognition software and may include unintentional dictation errors.    Willy Eddy, MD 03/08/23 [phone]

## 2023-03-08 NOTE — ED Notes (Signed)
ED Provider at bedside.
